# Patient Record
Sex: Female | Born: 1960
Health system: Southern US, Community
[De-identification: ages and names within clinical notes are randomized; demographics above are authoritative.]

## PROBLEM LIST (undated history)

## (undated) DIAGNOSIS — N920 Excessive and frequent menstruation with regular cycle: Secondary | ICD-10-CM

## (undated) DIAGNOSIS — Z973 Presence of spectacles and contact lenses: Secondary | ICD-10-CM

## (undated) DIAGNOSIS — D509 Iron deficiency anemia, unspecified: Secondary | ICD-10-CM

## (undated) DIAGNOSIS — D219 Benign neoplasm of connective and other soft tissue, unspecified: Secondary | ICD-10-CM

## (undated) HISTORY — PX: WISDOM TOOTH EXTRACTION: SHX21

---

## 2002-10-07 ENCOUNTER — Other Ambulatory Visit: Admission: RE | Admit: 2002-10-07 | Discharge: 2002-10-07 | Payer: Self-pay | Admitting: Obstetrics & Gynecology

## 2003-05-19 ENCOUNTER — Inpatient Hospital Stay (HOSPITAL_COMMUNITY): Admission: AD | Admit: 2003-05-19 | Discharge: 2003-05-20 | Payer: Self-pay | Admitting: Obstetrics & Gynecology

## 2003-06-29 ENCOUNTER — Other Ambulatory Visit: Admission: RE | Admit: 2003-06-29 | Discharge: 2003-06-29 | Payer: Self-pay | Admitting: Obstetrics & Gynecology

## 2004-09-18 ENCOUNTER — Encounter: Admission: RE | Admit: 2004-09-18 | Discharge: 2004-09-18 | Payer: Self-pay | Admitting: Obstetrics & Gynecology

## 2005-11-05 ENCOUNTER — Encounter: Admission: RE | Admit: 2005-11-05 | Discharge: 2005-11-05 | Payer: Self-pay | Admitting: Obstetrics & Gynecology

## 2005-12-06 ENCOUNTER — Encounter: Admission: RE | Admit: 2005-12-06 | Discharge: 2005-12-06 | Payer: Self-pay | Admitting: Obstetrics & Gynecology

## 2007-01-27 ENCOUNTER — Encounter: Admission: RE | Admit: 2007-01-27 | Discharge: 2007-01-27 | Payer: Self-pay | Admitting: Obstetrics & Gynecology

## 2008-03-15 ENCOUNTER — Encounter: Admission: RE | Admit: 2008-03-15 | Discharge: 2008-03-15 | Payer: Self-pay | Admitting: Obstetrics & Gynecology

## 2009-07-25 ENCOUNTER — Encounter: Admission: RE | Admit: 2009-07-25 | Discharge: 2009-07-25 | Payer: Self-pay | Admitting: Obstetrics & Gynecology

## 2010-12-01 NOTE — H&P (Signed)
NAMETHAI, HEMRICK                        ACCOUNT NO.:  [id number]   MEDICAL RECORD NO.:  [id number]                   PATIENT TYPE:  INP   LOCATION:  9116                                 FACILITY:  [facility]   PHYSICIAN:  Genia Del, M.D.             DATE OF BIRTH:  17-Jul-1960   DATE OF ADMISSION:  05/19/2003  DATE OF DISCHARGE:                                HISTORY & PHYSICAL   HISTORY AND PHYSICAL:  Mrs. Goffredo is a 50 year old G2, P36, expected date  of delivery May 19, 2003 at [redacted] weeks gestation.   REASON FOR ADMISSION:  Induction, history of preeclampsia.   HISTORY OF PRESENT ILLNESS:  Fetal movements positive.  No regular uterine  contractions.  No vaginal bleeding.  No fluid leak.  No current PIH  symptoms.   PAST MEDICAL HISTORY:  Positive for mild asthma and migraines.   PAST SURGICAL HISTORY:  Negative.   PAST OBSTETRICAL HISTORY:  G1 September 2003 - 40 weeks, spontaneous vaginal  delivery, baby girl, 9 pounds and 2 ounces with preeclampsia, no  complication.   ALLERGIES:  No known drug allergies.   MEDICATIONS:  Prenate vitamins.   SOCIAL HISTORY:  Married, nonsmoker.   HISTORY OF PRESENT PREGNANCY:  First trimester normal.  Labs showed  hemoglobin at 12.4, platelets 217, blood group B positive, Rh antibodies  negative, RPR nonreactive, HBsAg negative, HIV nonreactive, rubella titer is  immune, Pap smear test normal, gonorrhea and Chlamydia negative.  Because of  advanced maternal age patient opted for chorionic villus sampling at 9-plus  weeks, chromosomes came back normal and no complication occurred following  the procedure.  In the second trimester alpha-fetoproteins were within  normal and ultrasound review of anatomy at 21-plus weeks was within normal  limits, placenta anterior normal, cervix 3.8 cm and closed, dating  corresponded and amniotic fluid was normal.  In the third trimester 1-hour  GGT was within normal limits at 139,  hemoglobin at 13, blood pressures  remained normal throughout pregnancy although diastolics were on occasions  in the 80s, maximum was 84 diastolic at 36-plus weeks, proteins remained  negative and no PIH symptoms occurred, group B strep was positive at 35-plus  weeks.  Uterine heights corresponded well throughout pregnancy.   REVIEW OF SYSTEMS:  CONSTITUTIONAL:  Negative.  HEENT:  Negative.  CARDIOVASCULAR/RESPIRATORY:  Negative.  UROLOGY/GI:  Negative.  DERMATOLOGICAL/NEUROLOGICAL/ENDOCRINOLOGICAL:  Negative.   PHYSICAL EXAMINATION:  GENERAL:  No apparent distress.  VITAL SIGNS:  Stable.  Blood pressure 114/72, pulse regular, respiratory  rate normal, afebrile.  LUNGS:  Clear bilaterally.  HEART:  Regular cardiac rhythm.  ABDOMEN:  Gravid uterus, uterine height 36 cm, cephalic presentation.  CERVIX:  Vaginal exam 3 cm dilated, 70-80% effaced, vertex, -1.  Membranes  intact.  EXTREMITIES:  Lower limbs normal.  No edema.   Fetal heart rate 130 per minute.   IMPRESSION:  G2 P1 at [redacted] weeks gestation, history  of preeclampsia in first  pregnancy, no current symptoms of preeclampsia, group B streptococcus  positive, fetal well being reassuring, favorable cervix.   PLAN:  Induction with low-dose Pitocin and artificial rupture of membranes,  monitoring, expectant management towards probable vaginal delivery.                                               Genia Del, M.D.    ML/MEDQ  D:  05/19/2003  T:  05/19/2003  Job:  [id number]

## 2011-01-30 ENCOUNTER — Other Ambulatory Visit: Payer: Self-pay | Admitting: Obstetrics & Gynecology

## 2011-01-30 DIAGNOSIS — Z1231 Encounter for screening mammogram for malignant neoplasm of breast: Secondary | ICD-10-CM

## 2011-02-12 ENCOUNTER — Ambulatory Visit
Admission: RE | Admit: 2011-02-12 | Discharge: 2011-02-12 | Disposition: A | Discharge status: Home / Self Care | Payer: 59 | Source: Ambulatory Visit | Attending: Obstetrics & Gynecology | Admitting: Obstetrics & Gynecology

## 2011-02-12 DIAGNOSIS — Z1231 Encounter for screening mammogram for malignant neoplasm of breast: Secondary | ICD-10-CM

## 2011-02-13 ENCOUNTER — Other Ambulatory Visit: Payer: Self-pay | Admitting: Obstetrics & Gynecology

## 2011-02-13 DIAGNOSIS — R928 Other abnormal and inconclusive findings on diagnostic imaging of breast: Secondary | ICD-10-CM

## 2011-02-22 ENCOUNTER — Ambulatory Visit
Admission: RE | Admit: 2011-02-22 | Discharge: 2011-02-22 | Disposition: A | Discharge status: Home / Self Care | Payer: 59 | Source: Ambulatory Visit | Attending: Obstetrics & Gynecology | Admitting: Obstetrics & Gynecology

## 2011-02-22 DIAGNOSIS — R928 Other abnormal and inconclusive findings on diagnostic imaging of breast: Secondary | ICD-10-CM

## 2014-11-24 ENCOUNTER — Other Ambulatory Visit: Payer: Self-pay | Admitting: Pediatrics

## 2014-11-24 MED ORDER — TRIAMCINOLONE ACETONIDE 0.1 % EX CREA: 1.0000 "application " | TOPICAL_CREAM | Freq: Two times a day (BID) | CUTANEOUS | Status: DC

## 2014-11-29 ENCOUNTER — Other Ambulatory Visit: Payer: Self-pay | Admitting: Pediatrics

## 2014-11-29 DIAGNOSIS — L03116 Cellulitis of left lower limb: Secondary | ICD-10-CM

## 2014-11-29 DIAGNOSIS — L237 Allergic contact dermatitis due to plants, except food: Secondary | ICD-10-CM

## 2014-11-29 MED ORDER — CEPHALEXIN 500 MG PO CAPS: 500.0000 mg | ORAL_CAPSULE | Freq: Three times a day (TID) | ORAL | Status: AC

## 2014-11-29 MED ORDER — PREDNISONE 20 MG PO TABS: ORAL_TABLET | ORAL | Status: DC

## 2014-11-29 NOTE — Progress Notes (Signed)
Seen in office with severe eruption of poison ivy for more than a week, now infected. Has used only topical steroid to date, without improvement.

## 2015-09-14 DIAGNOSIS — Z01419 Encounter for gynecological examination (general) (routine) without abnormal findings: Secondary | ICD-10-CM | POA: Diagnosis not present

## 2015-09-14 DIAGNOSIS — Z1151 Encounter for screening for human papillomavirus (HPV): Secondary | ICD-10-CM | POA: Diagnosis not present

## 2015-09-21 DIAGNOSIS — N920 Excessive and frequent menstruation with regular cycle: Secondary | ICD-10-CM | POA: Diagnosis not present

## 2015-10-11 ENCOUNTER — Other Ambulatory Visit: Payer: Self-pay | Admitting: Obstetrics & Gynecology

## 2015-10-26 ENCOUNTER — Encounter (HOSPITAL_COMMUNITY): Payer: Self-pay | Admitting: *Deleted

## 2015-10-26 ENCOUNTER — Ambulatory Visit (HOSPITAL_COMMUNITY): Payer: 59 | Admitting: Anesthesiology

## 2015-10-26 ENCOUNTER — Encounter (HOSPITAL_COMMUNITY)
Admission: RE | Disposition: A | Discharge status: Home / Self Care | Payer: Self-pay | Source: Ambulatory Visit | Attending: Obstetrics & Gynecology

## 2015-10-26 ENCOUNTER — Ambulatory Visit (HOSPITAL_COMMUNITY)
Admission: RE | Admit: 2015-10-26 | Discharge: 2015-10-26 | Disposition: A | Discharge status: Home / Self Care | Payer: 59 | Source: Ambulatory Visit | Attending: Obstetrics & Gynecology | Admitting: Obstetrics & Gynecology

## 2015-10-26 DIAGNOSIS — N854 Malposition of uterus: Secondary | ICD-10-CM | POA: Insufficient documentation

## 2015-10-26 DIAGNOSIS — D250 Submucous leiomyoma of uterus: Secondary | ICD-10-CM | POA: Insufficient documentation

## 2015-10-26 DIAGNOSIS — D649 Anemia, unspecified: Secondary | ICD-10-CM | POA: Diagnosis not present

## 2015-10-26 DIAGNOSIS — D259 Leiomyoma of uterus, unspecified: Secondary | ICD-10-CM | POA: Diagnosis not present

## 2015-10-26 HISTORY — PX: DILATATION & CURETTAGE/HYSTEROSCOPY WITH MYOSURE: SHX6511

## 2015-10-26 LAB — CBC
HCT: 38.2 % (ref 36.0–46.0)
HEMOGLOBIN: 12.8 g/dL (ref 12.0–15.0)
MCH: 30.5 pg (ref 26.0–34.0)
MCHC: 33.5 g/dL (ref 30.0–36.0)
MCV: 91.2 fL (ref 78.0–100.0)
Platelets: 173 10*3/uL (ref 150–400)
RBC: 4.19 MIL/uL (ref 3.87–5.11)
RDW: 14.4 % (ref 11.5–15.5)
WBC: 6.1 10*3/uL (ref 4.0–10.5)

## 2015-10-26 LAB — PREGNANCY, URINE: PREG TEST UR: NEGATIVE

## 2015-10-26 SURGERY — DILATATION & CURETTAGE/HYSTEROSCOPY WITH MYOSURE
Anesthesia: Monitor Anesthesia Care

## 2015-10-26 MED ORDER — LIDOCAINE HCL (CARDIAC) 20 MG/ML IV SOLN
INTRAVENOUS | Status: DC | PRN
  Administered 2015-10-26: 100 mg via INTRAVENOUS

## 2015-10-26 MED ORDER — ONDANSETRON HCL 4 MG/2ML IJ SOLN
INTRAMUSCULAR | Status: AC
  Filled 2015-10-26: qty 2

## 2015-10-26 MED ORDER — DEXAMETHASONE SODIUM PHOSPHATE 10 MG/ML IJ SOLN
INTRAMUSCULAR | Status: AC
  Filled 2015-10-26: qty 1

## 2015-10-26 MED ORDER — CHLOROPROCAINE HCL 1 % IJ SOLN
INTRAMUSCULAR | Status: AC
  Filled 2015-10-26: qty 30

## 2015-10-26 MED ORDER — CHLOROPROCAINE HCL 1 % IJ SOLN
INTRAMUSCULAR | Status: DC | PRN
  Administered 2015-10-26: 20 mL

## 2015-10-26 MED ORDER — HYDROCODONE-IBUPROFEN 5-200 MG PO TABS: 1.0000 | ORAL_TABLET | Freq: Three times a day (TID) | ORAL | Status: DC | PRN

## 2015-10-26 MED ORDER — SCOPOLAMINE 1 MG/3DAYS TD PT72
MEDICATED_PATCH | TRANSDERMAL | Status: AC
  Administered 2015-10-26: 1.5 mg via TRANSDERMAL
  Filled 2015-10-26: qty 1

## 2015-10-26 MED ORDER — PROPOFOL 10 MG/ML IV BOLUS
INTRAVENOUS | Status: AC
  Filled 2015-10-26: qty 20

## 2015-10-26 MED ORDER — SODIUM CHLORIDE 0.9 % IR SOLN
Status: DC | PRN
  Administered 2015-10-26: 3000 mL

## 2015-10-26 MED ORDER — KETOROLAC TROMETHAMINE 30 MG/ML IJ SOLN
INTRAMUSCULAR | Status: DC | PRN
  Administered 2015-10-26: 30 mg via INTRAVENOUS

## 2015-10-26 MED ORDER — OXYCODONE HCL 5 MG PO TABS: 5.0000 mg | ORAL_TABLET | Freq: Once | ORAL | Status: DC | PRN

## 2015-10-26 MED ORDER — LIDOCAINE HCL (CARDIAC) 20 MG/ML IV SOLN
INTRAVENOUS | Status: AC
  Filled 2015-10-26: qty 5

## 2015-10-26 MED ORDER — KETOROLAC TROMETHAMINE 30 MG/ML IJ SOLN
INTRAMUSCULAR | Status: AC
  Filled 2015-10-26: qty 1

## 2015-10-26 MED ORDER — SILVER NITRATE-POT NITRATE 75-25 % EX MISC
CUTANEOUS | Status: DC | PRN
  Administered 2015-10-26: 3

## 2015-10-26 MED ORDER — LACTATED RINGERS IV SOLN
INTRAVENOUS | Status: DC
  Administered 2015-10-26 (×3): via INTRAVENOUS

## 2015-10-26 MED ORDER — OXYCODONE HCL 5 MG/5ML PO SOLN: 5.0000 mg | Freq: Once | ORAL | Status: DC | PRN

## 2015-10-26 MED ORDER — CEFAZOLIN SODIUM-DEXTROSE 2-3 GM-% IV SOLR
INTRAVENOUS | Status: AC
  Filled 2015-10-26: qty 50

## 2015-10-26 MED ORDER — FENTANYL CITRATE (PF) 100 MCG/2ML IJ SOLN
INTRAMUSCULAR | Status: DC | PRN
  Administered 2015-10-26 (×2): 50 ug via INTRAVENOUS

## 2015-10-26 MED ORDER — FENTANYL CITRATE (PF) 100 MCG/2ML IJ SOLN
INTRAMUSCULAR | Status: AC
  Filled 2015-10-26: qty 2

## 2015-10-26 MED ORDER — MIDAZOLAM HCL 2 MG/2ML IJ SOLN
INTRAMUSCULAR | Status: AC
  Filled 2015-10-26: qty 2

## 2015-10-26 MED ORDER — ONDANSETRON HCL 4 MG/2ML IJ SOLN
INTRAMUSCULAR | Status: DC | PRN
  Administered 2015-10-26: 4 mg via INTRAVENOUS

## 2015-10-26 MED ORDER — MIDAZOLAM HCL 5 MG/5ML IJ SOLN
INTRAMUSCULAR | Status: DC | PRN
  Administered 2015-10-26: 2 mg via INTRAVENOUS

## 2015-10-26 MED ORDER — PROPOFOL 10 MG/ML IV BOLUS
INTRAVENOUS | Status: DC | PRN
  Administered 2015-10-26: 200 mg via INTRAVENOUS

## 2015-10-26 MED ORDER — MEPERIDINE HCL 25 MG/ML IJ SOLN: 6.2500 mg | INTRAMUSCULAR | Status: DC | PRN

## 2015-10-26 MED ORDER — CEFAZOLIN SODIUM-DEXTROSE 2-4 GM/100ML-% IV SOLN
2.0000 g | INTRAVENOUS | Status: AC
  Administered 2015-10-26: 2 g via INTRAVENOUS
  Filled 2015-10-26: qty 100

## 2015-10-26 MED ORDER — SCOPOLAMINE 1 MG/3DAYS TD PT72
1.0000 | MEDICATED_PATCH | Freq: Once | TRANSDERMAL | Status: DC
  Administered 2015-10-26: 1.5 mg via TRANSDERMAL

## 2015-10-26 MED ORDER — HYDROMORPHONE HCL 1 MG/ML IJ SOLN: 0.2500 mg | INTRAMUSCULAR | Status: DC | PRN

## 2015-10-26 MED ORDER — DEXAMETHASONE SODIUM PHOSPHATE 4 MG/ML IJ SOLN
INTRAMUSCULAR | Status: DC | PRN
  Administered 2015-10-26: 10 mg via INTRAVENOUS

## 2015-10-26 SURGICAL SUPPLY — 19 items
CANISTER SUCT 3000ML (MISCELLANEOUS) ×3 IMPLANT
CATH ROBINSON RED A/P 16FR (CATHETERS) ×3 IMPLANT
CLOTH BEACON ORANGE TIMEOUT ST (SAFETY) ×3 IMPLANT
DEVICE MYOSURE LITE (MISCELLANEOUS) IMPLANT
DEVICE MYOSURE REACH (MISCELLANEOUS) ×2 IMPLANT
ELECT REM PT RETURN 9FT ADLT (ELECTROSURGICAL) ×3
ELECTRODE REM PT RTRN 9FT ADLT (ELECTROSURGICAL) ×1 IMPLANT
FILTER ARTHROSCOPY CONVERTOR (FILTER) ×3 IMPLANT
GLOVE BIO SURGEON STRL SZ 6.5 (GLOVE) ×2 IMPLANT
GLOVE BIO SURGEONS STRL SZ 6.5 (GLOVE) ×1
GLOVE BIOGEL PI IND STRL 7.0 (GLOVE) ×2 IMPLANT
GLOVE BIOGEL PI INDICATOR 7.0 (GLOVE) ×4
GOWN STRL REUS W/TWL LRG LVL3 (GOWN DISPOSABLE) ×6 IMPLANT
PACK VAGINAL MINOR WOMEN LF (CUSTOM PROCEDURE TRAY) ×3 IMPLANT
PAD OB MATERNITY 4.3X12.25 (PERSONAL CARE ITEMS) ×3 IMPLANT
SEAL ROD LENS SCOPE MYOSURE (ABLATOR) ×3 IMPLANT
TOWEL OR 17X24 6PK STRL BLUE (TOWEL DISPOSABLE) ×6 IMPLANT
TUBING AQUILEX INFLOW (TUBING) ×3 IMPLANT
TUBING AQUILEX OUTFLOW (TUBING) ×3 IMPLANT

## 2015-10-26 NOTE — Discharge Instructions (Addendum)
Hysteroscopy, Care After °Refer to this sheet in the next few weeks. These instructions provide you with information on caring for yourself after your procedure. Your health care provider may also give you more specific instructions. Your treatment has been planned according to current medical practices, but problems sometimes occur. Call your health care provider if you have any problems or questions after your procedure.  °WHAT TO EXPECT AFTER THE PROCEDURE °After your procedure, it is typical to have the following: °· You may have some cramping. This normally lasts for a couple days. °· You may have bleeding. This can vary from light spotting for a few days to menstrual-like bleeding for 3-7 days. °HOME CARE INSTRUCTIONS °· Rest for the first 1-2 days after the procedure. °· Only take over-the-counter or prescription medicines as directed by your health care provider. Do not take aspirin. It can increase the chances of bleeding. °· Take showers instead of baths for 2 weeks or as directed by your health care provider. °· Do not drive for 24 hours or as directed. °· Do not drink alcohol while taking pain medicine. °· Do not use tampons, douche, or have sexual intercourse for 2 weeks or until your health care provider says it is okay. °· Take your temperature twice a day for 4-5 days. Write it down each time. °· Follow your health care provider's advice about diet, exercise, and lifting. °· If you develop constipation, you may: °¨ Take a mild laxative if your health care provider approves. °¨ Add bran foods to your diet. °¨ Drink enough fluids to keep your urine clear or pale yellow. °· Try to have someone with you or available to you for the first 24-48 hours, especially if you were given a general anesthetic. °· Follow up with your health care provider as directed. °SEEK MEDICAL CARE IF: °· You feel dizzy or lightheaded. °· You feel sick to your stomach (nauseous). °· You have abnormal vaginal discharge. °· You  have a rash. °· You have pain that is not controlled with medicine. °SEEK IMMEDIATE MEDICAL CARE IF: °· You have bleeding that is heavier than a normal menstrual period. °· You have a fever. °· You have increasing cramps or pain, not controlled with medicine. °· You have new belly (abdominal) pain. °· You pass out. °· You have pain in the tops of your shoulders (shoulder strap areas). °· You have shortness of breath. °  °This information is not intended to replace advice given to you by your health care provider. Make sure you discuss any questions you have with your health care provider. °  °Document Released: 04/22/2013 Document Reviewed: 04/22/2013 °Elsevier Interactive Patient Education ©2016 [employer] ° ° °Post Anesthesia Home Care Instructions ° °Activity: °Get plenty of rest for the remainder of the day. A responsible adult should stay with you for 24 hours following the procedure.  °For the next 24 hours, DO NOT: °-Drive a car °-Operate machinery °-Drink alcoholic beverages °-Take any medication unless instructed by your physician °-Make any legal decisions or sign important papers. ° °Meals: °Start with liquid foods such as gelatin or soup. Progress to regular foods as tolerated. Avoid greasy, spicy, heavy foods. If nausea and/or vomiting occur, drink only clear liquids until the nausea and/or vomiting subsides. Call your physician if vomiting continues. ° °Special Instructions/Symptoms: °Your throat may feel dry or sore from the anesthesia or the breathing tube placed in your throat during surgery. If this causes discomfort, gargle with warm salt water. The   discomfort should disappear within 24 hours. ° °If you had a scopolamine patch placed behind your ear for the management of post- operative nausea and/or vomiting: ° °1. The medication in the patch is effective for 72 hours, after which it should be removed.  Wrap patch in a tissue and discard in the trash. Wash hands thoroughly with soap and  water. °2. You may remove the patch earlier than 72 hours if you experience unpleasant side effects which may include dry mouth, dizziness or visual disturbances. °3. Avoid touching the patch. Wash your hands with soap and water after contact with the patch. °  ° ° °

## 2015-10-26 NOTE — Op Note (Signed)
10/26/2015  3:11 PM  PATIENT:  Kimberly Braun  55 y.o. female  PRE-OPERATIVE DIAGNOSIS:  Submucosal Fibroids  POST-OPERATIVE DIAGNOSIS:  Submucosal Fibroids  PROCEDURE:  Procedure(s): DILATATION & CURETTAGE/HYSTEROSCOPY WITH MYOSURE  SURGEON:  Surgeon(s): [name], MD  ASSISTANTS: None  ANESTHESIA:   general   PROCEDURE:  Under general anesthesia with laryngeal mask the patient is an lithotomy position. She was prepped with Betadine and the suprapubic, vulvar and vaginal areas. She is draped as usual. The bladder was catheterized. The vaginal exam reveals a retroverted uterus with no adnexal mass.  The speculum is inserted in the vagina and the anterior lip of the cervix is grasped with a tenaculum. A paracervical block is done with Nesacaine 1% a total of 20 cc at 4 and 8:00.  Dilation of the cervix with Hegar dilators up to 27 without difficulty.  The hysteroscope is inserted in the intrauterine cavity.  Inspection reveals 2 normal ostia, a thin endometrium, a left lower uterine submucosal myoma on a small pedicle, measuring about 2.5 cm in diameter.  A posterior myoma is also present with just a small bulging in the intrauterine cavity.  The Myosure instrument is inserted and the submucosal myoma is completely resected.  The posterior partially submucosal myoma is trimmed to bring it flush with the posterior uterine wall.  Hemostasis is adequate at all levels. Pictures were taken before and after resection.  The hysteroscope is removed and a systematic curettage is done with the sharp curet on all intrauterine surfaces.  Both resection material and curettage material sent to pathology together.  We go back in the intrauterine cavity with the hysteroscope and confirm a normal cavity with good hemostasis.  All instruments are removed.  Hemostasis was completed on the cervix with silver nitrate.  The speculum was removed.  The patient was brought to recovery room in good and stable  status.  ESTIMATED BLOOD LOSS: 75 cc FLUID DEFICIT:  400 cc   Intake/Output Summary (Last 24 hours) at 10/26/15 1511 Last data filed at 10/26/15 1507  Gross per 24 hour  Intake   1200 ml  Output    125 ml  Net   1075 ml     BLOOD ADMINISTERED:none   LOCAL MEDICATIONS USED:  Nesacaine 1%, Amount: 20 ml  SPECIMEN:  Source of Specimen:  Resection of SM myoma and endometrial curettings  DISPOSITION OF SPECIMEN:  PATHOLOGY  COUNTS:  YES  PLAN OF CARE: Transfer to PACU  [name]  10/26/2015 at 3:13 pm

## 2015-10-26 NOTE — H&P (Signed)
Kimberly Braun is an 55 y.o. 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 Anemia     Past Surgical History  Procedure Laterality Date  . Wisdom tooth extraction      History reviewed. No pertinent family history.  Social History:  reports that she has never smoked. She has never used smokeless tobacco. She reports that she does not drink alcohol or use illicit drugs.  Allergies: No Known Allergies  Prescriptions prior to admission  Medication Sig Dispense Refill Last Dose  . ferrous sulfate 325 (65 FE) MG tablet Take 325 mg by mouth 3 (three) times daily with meals.       ROS Neg  Blood pressure 138/78, pulse 53, temperature 97.8 F (36.6 C), temperature source Oral, resp. rate 20, height 5\' 5"  (1.651 m), weight 155 lb (70.308 kg), SpO2 100 %. Physical Exam   Pelvic US:  IU lesion c/w SM myoma 2.6 cm  Results for orders placed or performed during the hospital encounter of 10/26/15 (from the past 24 hour(s))  CBC     Status: None   Collection Time: 10/26/15  1:05 PM  Result Value Ref Range   WBC 6.1 4.0 - 10.5 K/uL   RBC 4.19 3.87 - 5.11 MIL/uL   Hemoglobin 12.8 12.0 - 15.0 g/dL   HCT 38.2 36.0 - 46.0 %   MCV 91.2 78.0 - 100.0 fL   MCH 30.5 26.0 - 34.0 pg   MCHC 33.5 30.0 - 36.0 g/dL   RDW 14.4 11.5 - 15.5 %   Platelets 173 150 - 400 K/uL  Pregnancy, urine     Status: None   Collection Time: 10/26/15  1:06 PM  Result Value Ref Range   Preg Test, Ur NEGATIVE NEGATIVE     Assessment/Plan: 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  Surgery and risks reviewed.  Informed consent signed.  Kimberly Braun,MARIE-LYNE 10/26/2015, 1:50 PM

## 2015-10-26 NOTE — Anesthesia Procedure Notes (Signed)
Procedure Name: LMA Insertion Date/Time: 10/26/2015 2:19 PM Performed by: Riki Sheer Pre-anesthesia Checklist: Patient identified, Emergency Drugs available, Suction available, Patient being monitored and Timeout performed Patient Re-evaluated:Patient Re-evaluated prior to inductionOxygen Delivery Method: Circle system utilized Preoxygenation: Pre-oxygenation with 100% oxygen Intubation Type: IV induction Ventilation: Mask ventilation without difficulty LMA: LMA inserted LMA Size: 4.0 Number of attempts: 1 Placement Confirmation: positive ETCO2,  CO2 detector and breath sounds checked- equal and bilateral Tube secured with: Tape Dental Injury: Teeth and Oropharynx as per pre-operative assessment

## 2015-10-26 NOTE — Transfer of Care (Signed)
Immediate Anesthesia Transfer of Care Note  Patient: Kimberly Braun  Procedure(s) Performed: Procedure(s): DILATATION & CURETTAGE/HYSTEROSCOPY WITH MYOSURE (N/A)  Patient Location: PACU  Anesthesia Type:General  Level of Consciousness: awake  Airway & Oxygen Therapy: Patient Spontanous Breathing  Post-op Assessment: Report given to PACU RN  Post vital signs: stable  Filed Vitals:   10/26/15 1308  BP: 138/78  Pulse: 53  Temp: 36.6 C  Resp: 20    Complications: No apparent anesthesia complications

## 2015-10-26 NOTE — Anesthesia Postprocedure Evaluation (Signed)
Anesthesia Post Note  Patient: Mystica Cederquist  Procedure(s) Performed: Procedure(s) (LRB): DILATATION & CURETTAGE/HYSTEROSCOPY WITH MYOSURE (N/A)  Patient location during evaluation: PACU Anesthesia Type: General Level of consciousness: awake and alert Pain management: pain level controlled Vital Signs Assessment: post-procedure vital signs reviewed and stable Respiratory status: spontaneous breathing, nonlabored ventilation, respiratory function stable and patient connected to nasal cannula oxygen Cardiovascular status: blood pressure returned to baseline and stable Postop Assessment: no signs of nausea or vomiting Anesthetic complications: no    Last Vitals:  Filed Vitals:   10/26/15 1640 10/26/15 1720  BP: 126/71 130/64  Pulse: 52 52  Temp: 36.6 C 36.6 C  Resp: 16 16    Last Pain: There were no vitals filed for this visit.               Effie Berkshire

## 2015-10-26 NOTE — Discharge Summary (Signed)
  Physician Discharge Summary  Patient ID: Kimberly Braun MRN: [id number] DOB/AGE: 12/22/1960 55 y.o.  Admit date: 10/26/2015 Discharge date: 10/26/2015  Admission Diagnoses: Submucosal Fibroids  Discharge Diagnoses: Submucosal Fibroids        Active Problems:   * No active hospital problems. *   Discharged Condition: good  Hospital Course: Outpatient  Consults: None  Treatments: surgery: Hysteroscopy Myosure resection, D+C  Disposition: D/C Home     Medication List    TAKE these medications        ferrous sulfate 325 (65 FE) MG tablet  Take 325 mg by mouth 3 (three) times daily with meals.     hydrocodone-ibuprofen 5-200 MG tablet  Commonly known as:  VICOPROFEN  Take 1 tablet by mouth every 8 (eight) hours as needed for pain.           Follow-up Information    Follow up with Maximillian Habibi,MARIE-LYNE, MD In 3 weeks.   Specialty:  Obstetrics and Gynecology   Contact information:   [address] [place] [address] [phone]       Signed: [name], MD 10/26/2015, 3:26 PM

## 2015-10-26 NOTE — Anesthesia Preprocedure Evaluation (Signed)
Anesthesia Evaluation  Patient identified by MRN, date of birth, ID band Patient awake    Reviewed: Allergy & Precautions, NPO status , Patient's Chart, lab work & pertinent test results  Airway Mallampati: II  TM Distance: >3 FB Neck ROM: Full    Dental no notable dental hx.    Pulmonary neg pulmonary ROS,    Pulmonary exam normal breath sounds clear to auscultation       Cardiovascular negative cardio ROS Normal cardiovascular exam Rhythm:Regular Rate:Normal     Neuro/Psych negative neurological ROS  negative psych ROS   GI/Hepatic negative GI ROS, Neg liver ROS,   Endo/Other  negative endocrine ROS  Renal/GU negative Renal ROS     Musculoskeletal negative musculoskeletal ROS (+)   Abdominal   Peds  Hematology negative hematology ROS (+)   Anesthesia Other Findings   Reproductive/Obstetrics negative OB ROS                             Anesthesia Physical Anesthesia Plan  ASA: I  Anesthesia Plan: MAC and General   Post-op Pain Management:    Induction: Intravenous  Airway Management Planned:   Additional Equipment:   Intra-op Plan:   Post-operative Plan: Extubation in OR  Informed Consent: I have reviewed the patients History and Physical, chart, labs and discussed the procedure including the risks, benefits and alternatives for the proposed anesthesia with the patient or authorized representative who has indicated his/her understanding and acceptance.   Dental advisory given  Plan Discussed with: CRNA  Anesthesia Plan Comments:         Anesthesia Quick Evaluation

## 2015-10-27 ENCOUNTER — Encounter (HOSPITAL_COMMUNITY): Payer: Self-pay | Admitting: Obstetrics & Gynecology

## 2015-11-16 DIAGNOSIS — N920 Excessive and frequent menstruation with regular cycle: Secondary | ICD-10-CM | POA: Diagnosis not present

## 2016-06-11 DIAGNOSIS — L723 Sebaceous cyst: Secondary | ICD-10-CM | POA: Diagnosis not present

## 2016-06-27 DIAGNOSIS — H52203 Unspecified astigmatism, bilateral: Secondary | ICD-10-CM | POA: Diagnosis not present

## 2016-06-27 DIAGNOSIS — H5213 Myopia, bilateral: Secondary | ICD-10-CM | POA: Diagnosis not present

## 2016-07-27 DIAGNOSIS — L7211 Pilar cyst: Secondary | ICD-10-CM | POA: Diagnosis not present

## 2016-07-27 DIAGNOSIS — L723 Sebaceous cyst: Secondary | ICD-10-CM | POA: Diagnosis not present

## 2017-06-18 ENCOUNTER — Other Ambulatory Visit: Payer: Self-pay | Admitting: Obstetrics & Gynecology

## 2017-06-18 DIAGNOSIS — Z139 Encounter for screening, unspecified: Secondary | ICD-10-CM

## 2017-07-04 ENCOUNTER — Encounter: Payer: Self-pay | Admitting: Obstetrics & Gynecology

## 2017-07-04 ENCOUNTER — Ambulatory Visit: Payer: 59 | Admitting: Obstetrics & Gynecology

## 2017-07-04 ENCOUNTER — Encounter (HOSPITAL_BASED_OUTPATIENT_CLINIC_OR_DEPARTMENT_OTHER): Payer: Self-pay | Admitting: *Deleted

## 2017-07-04 ENCOUNTER — Other Ambulatory Visit: Payer: Self-pay | Admitting: Obstetrics & Gynecology

## 2017-07-04 ENCOUNTER — Other Ambulatory Visit: Payer: Self-pay

## 2017-07-04 ENCOUNTER — Ambulatory Visit (INDEPENDENT_AMBULATORY_CARE_PROVIDER_SITE_OTHER): Payer: 59

## 2017-07-04 VITALS — BP 128/74

## 2017-07-04 DIAGNOSIS — Z01812 Encounter for preprocedural laboratory examination: Secondary | ICD-10-CM

## 2017-07-04 DIAGNOSIS — N921 Excessive and frequent menstruation with irregular cycle: Secondary | ICD-10-CM

## 2017-07-04 DIAGNOSIS — D649 Anemia, unspecified: Secondary | ICD-10-CM

## 2017-07-04 DIAGNOSIS — D250 Submucous leiomyoma of uterus: Secondary | ICD-10-CM

## 2017-07-04 LAB — CBC WITH DIFFERENTIAL/PLATELET
BASOS ABS: 43 {cells}/uL (ref 0–200)
Basophils Relative: 0.7 %
EOS PCT: 2.5 %
Eosinophils Absolute: 153 cells/uL (ref 15–500)
HEMATOCRIT: 27.3 % — AB (ref 35.0–45.0)
HEMOGLOBIN: 9.2 g/dL — AB (ref 11.7–15.5)
LYMPHS ABS: 1525 {cells}/uL (ref 850–3900)
MCH: 31.9 pg (ref 27.0–33.0)
MCHC: 33.7 g/dL (ref 32.0–36.0)
MCV: 94.8 fL (ref 80.0–100.0)
MPV: 8.8 fL (ref 7.5–12.5)
Monocytes Relative: 8.6 %
NEUTROS ABS: 3855 {cells}/uL (ref 1500–7800)
Neutrophils Relative %: 63.2 %
Platelets: 248 10*3/uL (ref 140–400)
RBC: 2.88 10*6/uL — AB (ref 3.80–5.10)
RDW: 13.1 % (ref 11.0–15.0)
Total Lymphocyte: 25 %
WBC mixed population: 525 cells/uL (ref 200–950)
WBC: 6.1 10*3/uL (ref 3.8–10.8)

## 2017-07-04 NOTE — Progress Notes (Addendum)
    Kimberly Braun [birth year] [id number]        56 y.o.  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  Physician.  RP:  Menometrorrhagia x 6 months  HPI:  Had [place], Myosure excision of a SM Myoma and partial resection of an IM/[place] posterior Myoma, D+C in 10/2015.  Pathology Benign.  Was well for about a year after the procedure, but for the last 6 months, has experienced heavy irregular bleeding.  Not seen for that problem and took Iron supplements only.  Last Hb last week at 8.9 per patient.  Feels week and tired.  Past medical history,surgical history, problem list, medications, allergies, family history and social history were all reviewed and documented in the EPIC chart.  Directed ROS with pertinent positives and negatives documented in the history of present illness/assessment and plan.  Exam:  Vitals:   07/04/17 1029  BP: 128/74   General appearance:  Normal  Abdomen:  Soft, NT, not distended, no mass felt.  Gyn exam:  Vulva normal.  Speculum:  Large necrotic prolapsed uterine Myoma through dilated cervix.  Myoma measures about 5 x 4 cm.  Bimanual exam:  Base of pedicle attachment of Myoma not reachable, probably wide and high.  Uterus AV, normal volume, mobile, NT.  No adnexal mass felt.  No current active bleeding.  Pelvic US today: T/V images.  Uterus anteverted and heterogenous measuring 8.46 x 5.66 x 5.86 cm.  Endometrial line normal at 7.3 mm.  Solid mass seen in the cervix/vaginal areas measuring 4.8 x 3.8 x 4.4 cm with feeder vessels extending from the myometrium to the mass.  Right and left ovaries normal.  No apparent mass in the right or left adnexa.  No free fluid in the posterior cul-de-sac.   Assessment/Plan:  56 y.o. G2P2   1. Submucous uterine fibroid Prolapsed partially necrotic uterine fibroid with Menometrorrhagia and secondary anemia.  Hb 9.2.  Will proceed with excision [facility] Myoma at [facility].  Surgery:  Excision of prolapsed/partially necrotic Myoma, possible Hysteroscopy,  D+C.  Surgery,  risks, benefits reviewed.  - US Transvaginal Non-OB  2. Menometrorrhagia As above - US Transvaginal Non-OB  3. Secondary anemia On Iron supplements.  4. Pre-operative laboratory examination Hb 9.2 - CBC w/Diff                        Patient was counseled as to the risk of surgery to include the following:  1. Infection (prohylactic antibiotics will be administered)  2. DVT/Pulmonary Embolism (prophylactic pneumo compression stockings will be used)  3.Trauma to internal organs requiring additional surgical procedure to repair any injury to internal organs requiring perhaps additional hospitalization days.  4.Hemmorhage requiring transfusion and blood products which carry risks such as anaphylactic reaction, hepatitis and AIDS  Patient had received literature information on the procedure scheduled and all her questions were answered and fully accepts all risk.   [name], 10:45 AM 07/04/2017

## 2017-07-04 NOTE — Progress Notes (Signed)
SPOKE W/ PT VIA PHONE FOR PRE-OP INTERVIEW.  NPO AFTER MN.  ARRIVE AT 0745.  PT GETTING LAB WORK DONE (CBC) AT OFFICE TODAY PER KATHY, DR [place], VIA PHONE. PRE-OP ORDERS PENDING.

## 2017-07-04 NOTE — Anesthesia Preprocedure Evaluation (Addendum)
Anesthesia Evaluation  Patient identified by MRN, date of birth, ID band Patient awake    Reviewed: Allergy & Precautions, NPO status , Patient's Chart, lab work & pertinent test results  Airway Mallampati: II  TM Distance: >3 FB Neck ROM: Full    Dental no notable dental hx. (+) Caps, Dental Advisory Given, Teeth Intact,    Pulmonary neg pulmonary ROS,    Pulmonary exam normal breath sounds clear to auscultation       Cardiovascular negative cardio ROS Normal cardiovascular exam Rhythm:Regular Rate:Normal     Neuro/Psych negative neurological ROS  negative psych ROS   GI/Hepatic negative GI ROS, Neg liver ROS,   Endo/Other  negative endocrine ROS  Renal/GU negative Renal ROS     Musculoskeletal negative musculoskeletal ROS (+)   Abdominal   Peds  Hematology negative hematology ROS (+) anemia ,   Anesthesia Other Findings   Reproductive/Obstetrics negative OB ROS                            Anesthesia Physical  Anesthesia Plan  ASA: I  Anesthesia Plan: MAC and General   Post-op Pain Management:    Induction: Intravenous  PONV Risk Score and Plan: 2 and Ondansetron, Dexamethasone and Treatment may vary due to age or medical condition  Airway Management Planned: LMA  Additional Equipment:   Intra-op Plan:   Post-operative Plan: Extubation in OR  Informed Consent: I have reviewed the patients History and Physical, chart, labs and discussed the procedure including the risks, benefits and alternatives for the proposed anesthesia with the patient or authorized representative who has indicated his/her understanding and acceptance.   Dental advisory given  Plan Discussed with: CRNA and Anesthesiologist  Anesthesia Plan Comments: ( )        Anesthesia Quick Evaluation

## 2017-07-05 ENCOUNTER — Other Ambulatory Visit: Payer: Self-pay

## 2017-07-05 ENCOUNTER — Encounter (HOSPITAL_BASED_OUTPATIENT_CLINIC_OR_DEPARTMENT_OTHER): Payer: Self-pay | Admitting: Anesthesiology

## 2017-07-05 ENCOUNTER — Encounter (HOSPITAL_BASED_OUTPATIENT_CLINIC_OR_DEPARTMENT_OTHER)
Admission: RE | Disposition: A | Discharge status: Home / Self Care | Payer: Self-pay | Source: Ambulatory Visit | Attending: Obstetrics & Gynecology

## 2017-07-05 ENCOUNTER — Ambulatory Visit (HOSPITAL_BASED_OUTPATIENT_CLINIC_OR_DEPARTMENT_OTHER): Payer: 59 | Admitting: Anesthesiology

## 2017-07-05 ENCOUNTER — Ambulatory Visit (HOSPITAL_BASED_OUTPATIENT_CLINIC_OR_DEPARTMENT_OTHER)
Admission: RE | Admit: 2017-07-05 | Discharge: 2017-07-05 | Disposition: A | Discharge status: Home / Self Care | Payer: 59 | Source: Ambulatory Visit | Attending: Obstetrics & Gynecology | Admitting: Obstetrics & Gynecology

## 2017-07-05 ENCOUNTER — Encounter (HOSPITAL_BASED_OUTPATIENT_CLINIC_OR_DEPARTMENT_OTHER): Payer: Self-pay | Admitting: *Deleted

## 2017-07-05 ENCOUNTER — Encounter: Payer: Self-pay | Admitting: Obstetrics & Gynecology

## 2017-07-05 DIAGNOSIS — Z79899 Other long term (current) drug therapy: Secondary | ICD-10-CM | POA: Diagnosis not present

## 2017-07-05 DIAGNOSIS — N920 Excessive and frequent menstruation with regular cycle: Secondary | ICD-10-CM | POA: Diagnosis not present

## 2017-07-05 DIAGNOSIS — D500 Iron deficiency anemia secondary to blood loss (chronic): Secondary | ICD-10-CM | POA: Diagnosis not present

## 2017-07-05 DIAGNOSIS — D649 Anemia, unspecified: Secondary | ICD-10-CM | POA: Diagnosis not present

## 2017-07-05 DIAGNOSIS — N921 Excessive and frequent menstruation with irregular cycle: Secondary | ICD-10-CM | POA: Diagnosis not present

## 2017-07-05 DIAGNOSIS — D259 Leiomyoma of uterus, unspecified: Secondary | ICD-10-CM | POA: Diagnosis not present

## 2017-07-05 DIAGNOSIS — D250 Submucous leiomyoma of uterus: Secondary | ICD-10-CM | POA: Insufficient documentation

## 2017-07-05 HISTORY — DX: Iron deficiency anemia, unspecified: D50.9

## 2017-07-05 HISTORY — PX: MYOMECTOMY: SHX85

## 2017-07-05 HISTORY — DX: Excessive and frequent menstruation with regular cycle: N92.0

## 2017-07-05 HISTORY — DX: Presence of spectacles and contact lenses: Z97.3

## 2017-07-05 HISTORY — DX: Benign neoplasm of connective and other soft tissue, unspecified: D21.9

## 2017-07-05 LAB — CBC
HEMATOCRIT: 27 % — AB (ref 36.0–46.0)
HEMOGLOBIN: 8.9 g/dL — AB (ref 12.0–15.0)
MCH: 32.4 pg (ref 26.0–34.0)
MCHC: 33 g/dL (ref 30.0–36.0)
MCV: 98.2 fL (ref 78.0–100.0)
Platelets: 216 10*3/uL (ref 150–400)
RBC: 2.75 MIL/uL — ABNORMAL LOW (ref 3.87–5.11)
RDW: 15.3 % (ref 11.5–15.5)
WBC: 4.6 10*3/uL (ref 4.0–10.5)

## 2017-07-05 LAB — TYPE AND SCREEN
ABO/RH(D): B POS
Antibody Screen: NEGATIVE

## 2017-07-05 LAB — POCT PREGNANCY, URINE: PREG TEST UR: NEGATIVE

## 2017-07-05 LAB — ABO/RH: ABO/RH(D): B POS

## 2017-07-05 SURGERY — MYOMECTOMY, UTERUS, VAGINAL APPROACH
Anesthesia: Monitor Anesthesia Care | Site: Vagina

## 2017-07-05 MED ORDER — FENTANYL CITRATE (PF) 100 MCG/2ML IJ SOLN
INTRAMUSCULAR | Status: AC
  Filled 2017-07-05: qty 2

## 2017-07-05 MED ORDER — ONDANSETRON HCL 4 MG/2ML IJ SOLN
4.0000 mg | Freq: Once | INTRAMUSCULAR | Status: DC | PRN
  Filled 2017-07-05: qty 2

## 2017-07-05 MED ORDER — MIDAZOLAM HCL 2 MG/2ML IJ SOLN
INTRAMUSCULAR | Status: DC | PRN
  Administered 2017-07-05: 2 mg via INTRAVENOUS

## 2017-07-05 MED ORDER — DEXAMETHASONE SODIUM PHOSPHATE 10 MG/ML IJ SOLN
INTRAMUSCULAR | Status: AC
  Filled 2017-07-05: qty 1

## 2017-07-05 MED ORDER — ONDANSETRON HCL 4 MG/2ML IJ SOLN
INTRAMUSCULAR | Status: DC | PRN
  Administered 2017-07-05: 4 mg via INTRAVENOUS

## 2017-07-05 MED ORDER — OXYCODONE HCL 5 MG/5ML PO SOLN
5.0000 mg | Freq: Once | ORAL | Status: DC | PRN
  Filled 2017-07-05: qty 5

## 2017-07-05 MED ORDER — MIDAZOLAM HCL 2 MG/2ML IJ SOLN
INTRAMUSCULAR | Status: AC
  Filled 2017-07-05: qty 2

## 2017-07-05 MED ORDER — LIDOCAINE 2% (20 MG/ML) 5 ML SYRINGE
INTRAMUSCULAR | Status: DC | PRN
  Administered 2017-07-05: 60 mg via INTRAVENOUS

## 2017-07-05 MED ORDER — FENTANYL CITRATE (PF) 100 MCG/2ML IJ SOLN
INTRAMUSCULAR | Status: DC | PRN
  Administered 2017-07-05: 50 ug via INTRAVENOUS

## 2017-07-05 MED ORDER — CEFAZOLIN SODIUM-DEXTROSE 2-4 GM/100ML-% IV SOLN
2.0000 g | INTRAVENOUS | Status: AC
  Administered 2017-07-05: 2 g via INTRAVENOUS
  Filled 2017-07-05: qty 100

## 2017-07-05 MED ORDER — KETOROLAC TROMETHAMINE 30 MG/ML IJ SOLN
INTRAMUSCULAR | Status: DC | PRN
  Administered 2017-07-05: 30 mg via INTRAVENOUS

## 2017-07-05 MED ORDER — OXYCODONE-ACETAMINOPHEN 7.5-325 MG PO TABS: 1.0000 | ORAL_TABLET | Freq: Four times a day (QID) | ORAL | 0 refills | Status: DC | PRN

## 2017-07-05 MED ORDER — ONDANSETRON HCL 4 MG/2ML IJ SOLN
INTRAMUSCULAR | Status: AC
  Filled 2017-07-05: qty 2

## 2017-07-05 MED ORDER — CHLOROPROCAINE HCL 1 % IJ SOLN
INTRAMUSCULAR | Status: DC | PRN
  Administered 2017-07-05: 20 mL

## 2017-07-05 MED ORDER — LIDOCAINE 2% (20 MG/ML) 5 ML SYRINGE
INTRAMUSCULAR | Status: AC
  Filled 2017-07-05: qty 5

## 2017-07-05 MED ORDER — CEFAZOLIN SODIUM-DEXTROSE 2-4 GM/100ML-% IV SOLN
INTRAVENOUS | Status: AC
  Filled 2017-07-05: qty 100

## 2017-07-05 MED ORDER — LACTATED RINGERS IV SOLN
INTRAVENOUS | Status: DC
  Filled 2017-07-05: qty 1000

## 2017-07-05 MED ORDER — PROPOFOL 10 MG/ML IV BOLUS
INTRAVENOUS | Status: DC | PRN
  Administered 2017-07-05: 160 mg via INTRAVENOUS

## 2017-07-05 MED ORDER — MEPERIDINE HCL 25 MG/ML IJ SOLN
6.2500 mg | INTRAMUSCULAR | Status: DC | PRN
  Filled 2017-07-05: qty 1

## 2017-07-05 MED ORDER — KETOROLAC TROMETHAMINE 30 MG/ML IJ SOLN
INTRAMUSCULAR | Status: AC
  Filled 2017-07-05: qty 1

## 2017-07-05 MED ORDER — FENTANYL CITRATE (PF) 100 MCG/2ML IJ SOLN
25.0000 ug | INTRAMUSCULAR | Status: DC | PRN
  Filled 2017-07-05: qty 1

## 2017-07-05 MED ORDER — LACTATED RINGERS IV SOLN
INTRAVENOUS | Status: DC
  Administered 2017-07-05 (×2): via INTRAVENOUS
  Filled 2017-07-05: qty 1000

## 2017-07-05 MED ORDER — DEXAMETHASONE SODIUM PHOSPHATE 10 MG/ML IJ SOLN
INTRAMUSCULAR | Status: DC | PRN
  Administered 2017-07-05: 10 mg via INTRAVENOUS

## 2017-07-05 MED ORDER — ACETAMINOPHEN 160 MG/5ML PO SOLN
325.0000 mg | ORAL | Status: DC | PRN
  Filled 2017-07-05: qty 20.3

## 2017-07-05 MED ORDER — OXYCODONE HCL 5 MG PO TABS
5.0000 mg | ORAL_TABLET | Freq: Once | ORAL | Status: DC | PRN
  Filled 2017-07-05: qty 1

## 2017-07-05 MED ORDER — PROPOFOL 10 MG/ML IV BOLUS
INTRAVENOUS | Status: AC
  Filled 2017-07-05: qty 40

## 2017-07-05 MED ORDER — ALBUMIN HUMAN 5 % IV SOLN
INTRAVENOUS | Status: DC | PRN
  Administered 2017-07-05: 10:00:00 via INTRAVENOUS

## 2017-07-05 MED ORDER — ALBUMIN HUMAN 5 % IV SOLN
INTRAVENOUS | Status: AC
  Filled 2017-07-05: qty 250

## 2017-07-05 MED ORDER — KETOROLAC TROMETHAMINE 30 MG/ML IJ SOLN
30.0000 mg | Freq: Once | INTRAMUSCULAR | Status: DC | PRN
  Filled 2017-07-05: qty 1

## 2017-07-05 MED ORDER — ACETAMINOPHEN 325 MG PO TABS
325.0000 mg | ORAL_TABLET | ORAL | Status: DC | PRN
  Filled 2017-07-05: qty 2

## 2017-07-05 SURGICAL SUPPLY — 32 items
BLADE EXTENDED COATED 6.5IN (ELECTRODE) ×3 IMPLANT
BLADE SURG 10 STRL SS (BLADE) ×3 IMPLANT
BLADE SURG 15 STRL LF DISP TIS (BLADE) ×1 IMPLANT
BLADE SURG 15 STRL SS (BLADE) ×4
CANISTER SUCT 3000ML PPV (MISCELLANEOUS) ×7 IMPLANT
CATH ROBINSON RED A/P 16FR (CATHETERS) ×4 IMPLANT
COUNTER NEEDLE 1200 MAGNETIC (NEEDLE) ×4 IMPLANT
DEVICE MYOSURE LITE (MISCELLANEOUS) IMPLANT
DEVICE MYOSURE REACH (MISCELLANEOUS) IMPLANT
DILATOR CANAL MILEX (MISCELLANEOUS) IMPLANT
ELECT REM PT RETURN 9FT ADLT (ELECTROSURGICAL) ×4
ELECTRODE REM PT RTRN 9FT ADLT (ELECTROSURGICAL) ×1 IMPLANT
FILTER ARTHROSCOPY CONVERTOR (FILTER) ×4 IMPLANT
GLOVE BIO SURGEON STRL SZ 6.5 (GLOVE) ×3 IMPLANT
GLOVE BIO SURGEONS STRL SZ 6.5 (GLOVE) ×1
GLOVE BIOGEL PI IND STRL 7.0 (GLOVE) ×4 IMPLANT
GLOVE BIOGEL PI INDICATOR 7.0 (GLOVE) ×4
GOWN STRL REUS W/TWL LRG LVL3 (GOWN DISPOSABLE) ×8 IMPLANT
IV NS IRRIG 3000ML ARTHROMATIC (IV SOLUTION) ×8 IMPLANT
MYOSURE XL FIBROID REM (MISCELLANEOUS)
PACK VAGINAL MINOR WOMEN LF (CUSTOM PROCEDURE TRAY) ×4 IMPLANT
PAD OB MATERNITY 4.3X12.25 (PERSONAL CARE ITEMS) ×4 IMPLANT
PAD PREP 24X48 CUFFED NSTRL (MISCELLANEOUS) ×4 IMPLANT
PENCIL BUTTON HOLSTER BLD 10FT (ELECTRODE) ×3 IMPLANT
SEAL ROD LENS SCOPE MYOSURE (ABLATOR) ×4 IMPLANT
SYSTEM TISS REMOVAL MYSR XL RM (MISCELLANEOUS) IMPLANT
TOWEL OR 17X24 6PK STRL BLUE (TOWEL DISPOSABLE) ×8 IMPLANT
TUBE CONNECTING 12'X1/4 (SUCTIONS) ×1
TUBE CONNECTING 12X1/4 (SUCTIONS) ×2 IMPLANT
TUBING AQUILEX INFLOW (TUBING) ×4 IMPLANT
TUBING AQUILEX OUTFLOW (TUBING) ×4 IMPLANT
YANKAUER SUCT BULB TIP NO VENT (SUCTIONS) ×3 IMPLANT

## 2017-07-05 NOTE — Anesthesia Postprocedure Evaluation (Signed)
Anesthesia Post Note  Patient: Kimberly Braun  Procedure(s) Performed: VAGINAL MYOMECTOMY, EXCISION OF FIBROID (N/A Vagina )     Patient location during evaluation: PACU Anesthesia Type: General Level of consciousness: awake and alert Pain management: pain level controlled Vital Signs Assessment: post-procedure vital signs reviewed and stable Respiratory status: spontaneous breathing, nonlabored ventilation, respiratory function stable and patient connected to nasal cannula oxygen Cardiovascular status: blood pressure returned to baseline and stable Postop Assessment: no apparent nausea or vomiting Anesthetic complications: no    Last Vitals:  Vitals:   07/05/17 1100 07/05/17 1115  BP: 119/83 124/84  Pulse: (!) 53 (!) 49  Resp: 10 11  Temp:    SpO2: 100% 100%    Last Pain:  Vitals:   07/05/17 0753  TempSrc: Oral                 Miroslav Gin

## 2017-07-05 NOTE — Transfer of Care (Signed)
Immediate Anesthesia Transfer of Care Note  Patient: Kimberly Braun  Procedure(s) Performed: VAGINAL MYOMECTOMY, EXCISION OF FIBROID (N/A Vagina )  Patient Location: PACU  Anesthesia Type:General  Level of Consciousness: awake, alert , oriented and patient cooperative  Airway & Oxygen Therapy: Patient Spontanous Breathing and Patient connected to nasal cannula oxygen  Post-op Assessment: Report given to RN and Post -op Vital signs reviewed and stable  Post vital signs: Reviewed and stable  Last Vitals:  Vitals:   07/05/17 0753  BP: 122/64  Pulse: 61  Resp: 16  Temp: 37.1 C  SpO2: 100%    Last Pain:  Vitals:   07/05/17 0753  TempSrc: Oral      Patients Stated Pain Goal: 6 (22/29/79 8921)  Complications: No apparent anesthesia complications

## 2017-07-05 NOTE — Op Note (Signed)
Operative Note  07/05/2017  10:51 AM  PATIENT:  Kimberly Braun  56 y.o. female  PRE-OPERATIVE DIAGNOSIS:  Prolapsed uterine fibroid, menorrhagia w anemia  POST-OPERATIVE DIAGNOSIS:  Prolapsed uterine fibroid, menorrhagia w anemia  PROCEDURE:  Procedure(s): VAGINAL MYOMECTOMY, EXCISION OF FIBROID  SURGEON:  Surgeon(s): [name], MD  ANESTHESIA:   general  FINDINGS:  Partially necrotic large submucosal uterine fibroid prolapsed in the vagina measuring 4 x 5 cm.  DESCRIPTION OF OPERATION: Under general anesthesia with laryngeal mask the patient is in lithotomy position.  She is prepped with Betadine on the suprapubic, vulvar and vaginal areas.  She is draped as usual.  Bladder is catheterized.  Timeout is done.  The vaginal exam confirms a large prolapsed uterine fibroid in the vagina with a normal size uterus and no adnexal mass.  The weighted speculum is inserted in the vagina.  A tenaculum was applied on the fibroid.  And another one at the posterior lip of the very dilated cervix.  A paracervical block is done with Nesacaine 1% a total of 20 cc at 4 and 8:00.  We used the Bovie with cauterization mode to progressively resect the fibroid in order to progress towards the attachment.  The fibroid measured about 4 cm x 5 cm.  Necrosis was present at its most vaginal portion, but the lower half of the fibroid towards the attachment was well vascularized with viable tissue.  The fibroid was attached with a wide base at the mid posterior aspect of the uterine cavity.  With the cervix dilated to at least 4 cm, we were able to put the retractor at the anterior aspect of the uterine cavity and obtained a very good visualization of the base of the fibroid.  We were able to resect the fibroid entirely and then cauterize the base for hemostasis.  Hemostasis was adequate.  All instruments were removed.  With the cervix so dilated a hysteroscopy was not possible, but anyways the intra-uterine  cavity was directly visualized and felt with the fingers and no lesion was present after complete resection of the fibroid.  The decision was made not to do a curettage to avoid disruption and bleeding at the bed of the just excised fibroid.  The weighted speculum was removed.  The patient was brought to recovery room in good and stable status.  ESTIMATED BLOOD LOSS: 200 mL   Intake/Output Summary (Last 24 hours) at 07/05/2017 1051 Last data filed at 07/05/2017 1037 Gross per 24 hour  Intake 1250 ml  Output 200 ml  Net 1050 ml     BLOOD ADMINISTERED:none  Albumin was given.  LOCAL MEDICATIONS USED: Nesacaine 1% Paracervical block  SPECIMEN:  Source of Specimen:  Uterine Fibroid  DISPOSITION OF SPECIMEN:  PATHOLOGY  COUNTS:  YES  PLAN OF CARE: Transfer to PACU  Marie-Lyne LavoieMD10:51 AM

## 2017-07-05 NOTE — Discharge Summary (Signed)
Physician Discharge Summary  Patient ID: Karaline Buresh MRN: [id number] DOB/AGE: 1960/07/23 56 y.o.  Admit date: 07/05/2017 Discharge date: 07/05/2017  Admission Diagnoses: prolapsed fibroid, menorrhagia w anemia   Discharge Diagnoses: Same Active Problems:   * No active hospital problems. *   Discharged Condition: good  Consults:None  Significant Diagnostic Studies: None  Treatments:surgery: Excision of Necrotic Submucosal Uterine Myoma Prolapsed in the vagina.  Vitals:   07/05/17 0753  BP: 122/64  Pulse: 61  Resp: 16  Temp: 98.7 F (37.1 C)  SpO2: 100%     Total I/O In: 1250 [I.V.:1000; IV Piggyback:250] Out: 200 [Blood:200]   Hospital Course: Good outpatient course  Discharge Exam: Good  Disposition: 01-Home or Self Care    Allergies as of 07/05/2017   No Known Allergies     Medication List    TAKE these medications   ferrous sulfate 325 (65 FE) MG tablet Take 325 mg by mouth 3 (three) times daily with meals.   oxyCODONE-acetaminophen 7.5-325 MG tablet Commonly known as:  PERCOCET Take 1 tablet by mouth every 6 (six) hours as needed for severe pain.        Follow-up Information    [name], MD Follow up in 3 week(s).   Specialty:  Obstetrics and Gynecology Contact information: [address] [place] [address] [phone]            Signed: [name] 07/05/2017, 10:48 AM

## 2017-07-05 NOTE — Anesthesia Procedure Notes (Signed)
Procedure Name: LMA Insertion Date/Time: 07/05/2017 9:47 AM Performed by: Wanita Chamberlain, CRNA Pre-anesthesia Checklist: Patient identified, Emergency Drugs available, Suction available, Patient being monitored and Timeout performed Patient Re-evaluated:Patient Re-evaluated prior to induction Oxygen Delivery Method: Circle system utilized Preoxygenation: Pre-oxygenation with 100% oxygen Induction Type: IV induction LMA: LMA inserted LMA Size: 4.0 Number of attempts: 1 Placement Confirmation: breath sounds checked- equal and bilateral and CO2 detector Tube secured with: Tape Dental Injury: Teeth and Oropharynx as per pre-operative assessment

## 2017-07-05 NOTE — Discharge Instructions (Addendum)
Myomectomy, Care After Refer to this sheet in the next few weeks. These instructions provide you with information on caring for yourself after your procedure. Your health care provider may also give you more specific instructions. Your treatment has been planned according to current medical practices, but problems sometimes occur. Call your health care provider if you have any problems or questions after your procedure. What can I expect after the procedure? After your procedure, it is typical to have the following:  Pain in your pelvis, cramping.  You will be given pain medicine to control the pain.  Tiredness. This is a normal part of the recovery process. Your energy level will return to normal over the next several weeks.  Constipation.  Vaginal bleeding. This is normal and should stop after 1-2 weeks.  Follow these instructions at home:  Only take over-the-counter or prescription medicines as directed by your health care provider. Avoid aspirin because it can cause bleeding.  Do not douche, use tampons, or have sexual intercourse until given permission by your health care provider.  Remove or change any bandages (dressings) as directed by your health care provider.  Take showers instead of baths as directed by your health care provider.  You will probably be able to go back to your normal routine after a few days. Do not do anything that requires extra effort until your health care provider says it is okay. Do not lift anything heavier than 15 pounds (6.8 kg) until your health care provider approves.  Walk daily but take frequent rest breaks if you tire easily.  Continue to practice deep breathing and coughing. If it hurts to cough, try holding a pillow against your belly as you cough.  If you become constipated, you may: ? Use a mild laxative if your health care provider approves. ? Add more fruit and bran to your diet. ? Drink enough fluids to keep your urine clear or pale  yellow.  Take your temperature twice a day and write it down.  Do not drink alcohol.  Do not drive until your health care provider approves.  Have someone help you at home for 1 week or until you can do your own household activities.  Follow up with your health care provider as directed. Contact a health care provider if:  You have a fever.  You have increasing pelvic pain that is not relieved with medicine.  You have nausea, vomiting, or diarrhea.  You have pain when you urinate, or you have blood in your urine.  You have a rash on your body.  You have pain or redness where your IV access tube was inserted.  You have redness, swelling, or any kind of drainage from an incision. Get help right away if:  You have weakness or lightheadedness.  You have pain, swelling, or redness in your legs.  You have chest pain.  You faint.  You have shortness of breath.  You have heavy vaginal bleeding.  Your incision is opening up. This information is not intended to replace advice given to you by your health care provider. Make sure you discuss any questions you have with your health care provider. Document Released: 11/22/2010 Document Revised: 12/08/2015 Document Reviewed: 02/11/2013 Elsevier Interactive Patient Education  2017 [place].  NO NSAIDS UNTIL 4:45 TODAY   Post Anesthesia Home Care Instructions  Activity: Get plenty of rest for the remainder of the day. A responsible individual must stay with you for 24 hours following the procedure.  For the  next 24 hours, DO NOT: -Drive a car -Paediatric nurse -Drink alcoholic beverages -Take any medication unless instructed by your physician -Make any legal decisions or sign important papers.  Meals: Start with liquid foods such as gelatin or soup. Progress to regular foods as tolerated. Avoid greasy, spicy, heavy foods. If nausea and/or vomiting occur, drink only clear liquids until the nausea and/or vomiting  subsides. Call your physician if vomiting continues.  Special Instructions/Symptoms: Your throat may feel dry or sore from the anesthesia or the breathing tube placed in your throat during surgery. If this causes discomfort, gargle with warm salt water. The discomfort should disappear within 24 hours.  If you had a scopolamine patch placed behind your ear for the management of post- operative nausea and/or vomiting:  1. The medication in the patch is effective for 72 hours, after which it should be removed.  Wrap patch in a tissue and discard in the trash. Wash hands thoroughly with soap and water. 2. You may remove the patch earlier than 72 hours if you experience unpleasant side effects which may include dry mouth, dizziness or visual disturbances. 3. Avoid touching the patch. Wash your hands with soap and water after contact with the patch.

## 2017-07-05 NOTE — H&P (Signed)
Kimberly Braun is an 56 y.o. female. 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  Physician.  RP:  Menometrorrhagia x 6 months  HPI:  No change x yesterday:  Had [place], Myosure excision of a SM Myoma and partial resection of an IM/[place] posterior Myoma, D+C in 10/2015.  Pathology Benign.  Was well for about a year after the procedure, but for the last 6 months, has experienced heavy irregular bleeding.  Not seen for that problem and took Iron supplements only.  Last Hb last week at 8.9 per patient.  Feels week and tired.   Pertinent Gynecological History: Menses: Menometrorrhagia x 6 months with secondary anemia Blood transfusions: none Sexually transmitted diseases: no past history Last mammogram: Normal Last pap: Normal   Menstrual History:  Patient's last menstrual period was 06/29/2017 (exact date).    Past Medical History:  Diagnosis Date  . Fibroid    VAGINA PROLAPSED  . Iron deficiency anemia   . Menorrhagia   . Wears glasses     Past Surgical History:  Procedure Laterality Date  . DILATATION & CURETTAGE/HYSTEROSCOPY WITH MYOSURE N/A 10/26/2015   Procedure: DILATATION & CURETTAGE/HYSTEROSCOPY WITH MYOSURE;  Surgeon: [name], MD;  Location: [facility];  Service: Gynecology;  Laterality: N/A;  . WISDOM TOOTH EXTRACTION      Family History  Problem Relation Age of Onset  . Hypertension Mother   . Breast cancer Paternal Aunt   . Cancer Maternal Grandfather        colon    Social History:  reports that  has never smoked. she has never used smokeless tobacco. She reports that she drinks alcohol. She reports that she does not use drugs.  Allergies: No Known Allergies  Medications Prior to Admission  Medication Sig Dispense Refill Last Dose  . ferrous sulfate 325 (65 FE) MG tablet Take 325 mg by mouth 3 (three) times daily with meals.    Taking    REVIEW OF SYSTEMS: A ROS was performed and pertinent positives and negatives are included in the history.  GENERAL: No fevers or chills. HEENT:  No change in vision, no earache, sore throat or sinus congestion. NECK: No pain or stiffness. CARDIOVASCULAR: No chest pain or pressure. No palpitations. PULMONARY: No shortness of breath, cough or wheeze. GASTROINTESTINAL: No abdominal pain, nausea, vomiting or diarrhea, melena or bright red blood per rectum. GENITOURINARY: No urinary frequency, urgency, hesitancy or dysuria. MUSCULOSKELETAL: No joint or muscle pain, no back pain, no recent trauma. DERMATOLOGIC: No rash, no itching, no lesions. ENDOCRINE: No polyuria, polydipsia, no heat or cold intolerance. No recent change in weight. HEMATOLOGICAL: No anemia or easy bruising or bleeding. NEUROLOGIC: No headache, seizures, numbness, tingling or weakness. PSYCHIATRIC: No depression, no loss of interest in normal activity or change in sleep pattern.     Blood pressure 122/64, pulse 61, temperature 98.7 F (37.1 C), temperature source Oral, resp. rate 16, height 5\' 5"  (1.651 m), weight 151 lb 14.4 oz (68.9 kg), last menstrual period 06/29/2017, SpO2 100 %.  Physical Exam:  See office notes  Results for orders placed or performed in visit on 07/04/17 (from the past 24 hour(s))  CBC w/Diff     Status: Abnormal   Collection Time: 07/04/17 11:22 AM  Result Value Ref Range   WBC 6.1 3.8 - 10.8 Thousand/uL   RBC 2.88 (L) 3.80 - 5.10 Million/uL   Hemoglobin 9.2 (L) 11.7 - 15.5 g/dL   HCT 27.3 (L) 35.0 - 45.0 %   MCV 94.8 80.0 - 100.0 fL  MCH 31.9 27.0 - 33.0 pg   MCHC 33.7 32.0 - 36.0 g/dL   RDW 13.1 11.0 - 15.0 %   Platelets 248 140 - 400 Thousand/uL   MPV 8.8 7.5 - 12.5 fL   Neutro Abs 3,855 1,500 - 7,800 cells/uL   Lymphs Abs 1,525 850 - 3,900 cells/uL   WBC mixed population 525 200 - 950 cells/uL   Eosinophils Absolute 153 15 - 500 cells/uL   Basophils Absolute 43 0 - 200 cells/uL   Neutrophils Relative % 63.2 %   Total Lymphocyte 25.0 %   Monocytes Relative 8.6 %   Eosinophils Relative 2.5 %   Basophils Relative 0.7 %   Pelvic US  today:  Large prolapsed, partially necrotic Fibroid 4x5 cm with submucosal attachment.  Uterus otherwise normal.  Bilateral ovaries normal   Assessment/Plan:  56 y.o. G2P2   1. Submucous uterine fibroid Prolapsed partially necrotic uterine fibroid with Menometrorrhagia and secondary anemia.  Hb 9.2.  Will proceed with excision of Myoma at [facility].  Surgery:  Excision of prolapsed/partially necrotic Myoma, possible Hysteroscopy,  D+C.  Surgery, risks, benefits reviewed.  - US Transvaginal Non-OB  2. Menometrorrhagia As above - US Transvaginal Non-OB  3. Secondary anemia On Iron supplements.  4. Pre-operative laboratory examination Hb 9.2 - CBC w/Diff                        Patient was counseled as to the risk of surgery to include the following:  1. Infection (prohylactic antibiotics will be administered)  2. DVT/Pulmonary Embolism (prophylactic pneumo compression stockings will be used)  3.Trauma to internal organs requiring additional surgical procedure to repair any injury to internal organs requiring perhaps additional hospitalization days.  4.Hemmorhage requiring transfusion and blood products which carry risks such as anaphylactic reaction, hepatitis and AIDS  Patient had received literature information on the procedure scheduled and all her questions were answered and fully accepts all risk.  Kimberly Braun 07/05/2017, 8:26 AM

## 2017-07-05 NOTE — Patient Instructions (Signed)
Pelvic US today:  Large prolapsed, partially necrotic Fibroid 4x5 cm with submucosal attachment.  Uterus otherwise normal.  Bilateral ovaries normal  1. Submucous uterine fibroid Prolapsed partially necrotic uterine fibroid with Menometrorrhagia and secondary anemia.  Hb 9.2.  Will proceed with excision of Myoma at [facility].  Surgery:  Excision of prolapsed/partially necrotic Myoma, possible Hysteroscopy,  D+C.  Surgery, risks, benefits reviewed.  - US Transvaginal Non-OB  2. Menometrorrhagia As above - US Transvaginal Non-OB  3. Secondary anemia On Iron supplements.  4. Pre-operative laboratory examination Hb 9.2 - CBC w/Diff  Deidre Ala, I am glad you came to see me today!

## 2017-07-10 ENCOUNTER — Encounter (HOSPITAL_BASED_OUTPATIENT_CLINIC_OR_DEPARTMENT_OTHER): Payer: Self-pay | Admitting: Obstetrics & Gynecology

## 2017-07-29 ENCOUNTER — Ambulatory Visit
Admission: RE | Admit: 2017-07-29 | Discharge: 2017-07-29 | Disposition: A | Discharge status: Home / Self Care | Payer: 59 | Source: Ambulatory Visit | Attending: Obstetrics & Gynecology | Admitting: Obstetrics & Gynecology

## 2017-07-29 DIAGNOSIS — Z1231 Encounter for screening mammogram for malignant neoplasm of breast: Secondary | ICD-10-CM | POA: Diagnosis not present

## 2017-07-29 DIAGNOSIS — Z139 Encounter for screening, unspecified: Secondary | ICD-10-CM

## 2017-08-01 ENCOUNTER — Ambulatory Visit (INDEPENDENT_AMBULATORY_CARE_PROVIDER_SITE_OTHER): Payer: 59 | Admitting: Obstetrics & Gynecology

## 2017-08-01 ENCOUNTER — Encounter: Payer: Self-pay | Admitting: Obstetrics & Gynecology

## 2017-08-01 VITALS — BP 132/78 | Ht 64.5 in | Wt 153.0 lb

## 2017-08-01 DIAGNOSIS — Z789 Other specified health status: Secondary | ICD-10-CM

## 2017-08-01 DIAGNOSIS — Z01419 Encounter for gynecological examination (general) (routine) without abnormal findings: Secondary | ICD-10-CM

## 2017-08-01 NOTE — Progress Notes (Signed)
Kimberly Braun 08/03/1960 [id number]   History:    57 y.o. G2P2L2 Married x 15 years.  Patient is a pediatrician.  RP:  Established patient presenting for annual gyn exam   HPI: Had a myomectomy of a submucosal uterine myoma that was prolapsing vaginally on July 05, 2017.  Has done very well postop with no further vaginal bleeding and no pelvic pain.  Feels that has more energy and is now able to climb 3 flights of stairs without shortness of breath.  No menopausal symptoms.  Uses condoms for contraception.  Urine and bowel movements normal.  Breasts normal.  Screening mammogram negative January 2019.  Health labs normal on physicians' day at [facility].  No colonoscopy done yet.  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  Gynecologic History No LMP recorded. Patient is not currently having periods (Reason: Other). Contraception: condoms as needed Last Pap: 2016. Results were: normal Last mammogram: 07/2017. Results were: Negative Will schedule screening colonoscopy  Obstetric History OB History  Gravida Para Term Preterm AB Living  2 2       2   SAB TAB Ectopic Multiple Live Births               # Outcome Date GA Lbr Len/2nd Weight Sex Delivery Anes PTL Lv  2 Para           1 Para                ROS: A ROS was performed and pertinent positives and negatives are included in the history.  GENERAL: No fevers or chills. HEENT: No change in vision, no earache, sore throat or sinus congestion. NECK: No pain or stiffness. CARDIOVASCULAR: No chest pain or pressure. No palpitations. PULMONARY: No shortness of breath, cough or wheeze. GASTROINTESTINAL: No abdominal pain, nausea, vomiting or diarrhea, melena or bright red blood per rectum. GENITOURINARY: No urinary frequency,  urgency, hesitancy or dysuria. MUSCULOSKELETAL: No joint or muscle pain, no back pain, no recent trauma. DERMATOLOGIC: No rash, no itching, no lesions. ENDOCRINE: No polyuria, polydipsia, no heat or cold intolerance. No recent change in weight. HEMATOLOGICAL: No anemia or easy bruising or bleeding. NEUROLOGIC: No headache, seizures, numbness, tingling or weakness. PSYCHIATRIC: No depression, no loss of interest in normal activity or change in sleep pattern.     Exam:   BP 132/78   Ht 5' 4.5" (1.638 m)   Wt 153 lb (69.4 kg)   BMI 25.86 kg/m   Body mass index is 25.86 kg/m.  General appearance : Well developed well nourished female. No acute distress HEENT: Eyes: no retinal hemorrhage or exudates,  Neck supple, trachea midline, no carotid bruits, no thyroidmegaly Lungs: Clear to auscultation, no rhonchi or wheezes, or rib retractions  Heart: Regular rate and rhythm, no murmurs or gallops Breast:Examined in sitting and supine position were symmetrical in appearance, no palpable masses or tenderness,  no skin retraction, no nipple inversion, no nipple discharge, no skin discoloration, no axillary or supraclavicular lymphadenopathy Abdomen: no palpable masses or tenderness, no rebound or guarding Extremities: no edema or skin discoloration or tenderness  Pelvic: Vulva normal  Bartholin, Urethra, Skene Glands: Within normal limits             Vagina: No gross lesions or discharge  Cervix: No gross lesions or discharge.  Pap  reflex done.  Uterus  AV, normal size, shape and consistency, non-tender and mobile  Adnexa  Without masses or tenderness  Anus and perineum  normal   Assessment/Plan:  57 y.o. female for annual exam   1. Encounter for routine gynecological examination with Papanicolaou smear of cervix Normal gynecologic exam.  Pap reflex done.  Breast exam normal.  Recent mammogram negative January 2019.  Health labs with [facility].  Will schedule screening colonoscopy. - Pap IG w/  reflex to HPV when [facility]-U  2. Use of condoms for contraception   [name], 2:19 PM 08/01/2017

## 2017-08-04 ENCOUNTER — Encounter: Payer: Self-pay | Admitting: Obstetrics & Gynecology

## 2017-08-04 NOTE — Patient Instructions (Signed)
1. Encounter for routine gynecological examination with Papanicolaou smear of cervix Normal gynecologic exam.  Pap reflex done.  Breast exam normal.  Recent mammogram negative January 2019.  Health labs with [facility].  Will schedule screening colonoscopy. - Pap IG w/ reflex to HPV when [facility]  2. Use of condoms for contraception  Sandie, good seeing you doing so well!  I will inform you of your results as soon as they are available.  Health Maintenance, Female Adopting a healthy lifestyle and getting preventive care can go a long way to promote health and wellness. Talk with your health care provider about what schedule of regular examinations is right for you. This is a good chance for you to check in with your provider about disease prevention and staying healthy. In between checkups, there are plenty of things you can do on your own. Experts have done a lot of research about which lifestyle changes and preventive measures are most likely to keep you healthy. Ask your health care provider for more information. Weight and diet Eat a healthy diet  Be sure to include plenty of vegetables, fruits, low-fat dairy products, and lean protein.  Do not eat a lot of foods high in solid fats, added sugars, or salt.  Get regular exercise. This is one of the most important things you can do for your health. ? Most adults should exercise for at least 150 minutes each week. The exercise should increase your heart rate and make you sweat (moderate-intensity exercise). ? Most adults should also do strengthening exercises at least twice a week. This is in addition to the moderate-intensity exercise.  Maintain a healthy weight  Body mass index (BMI) is a measurement that can be used to identify possible weight problems. It estimates body fat based on height and weight. Your health care provider can help determine your BMI and help you achieve or maintain a healthy weight.  For females 6 years of age and  older: ? A BMI below 18.5 is considered underweight. ? A BMI of 18.5 to 24.9 is normal. ? A BMI of 25 to 29.9 is considered overweight. ? A BMI of 30 and above is considered obese.  Watch levels of cholesterol and blood lipids  You should start having your blood tested for lipids and cholesterol at 56 years of age, then have this test every 5 years.  You may need to have your cholesterol levels checked more often if: ? Your lipid or cholesterol levels are high. ? You are older than 56 years of age. ? You are at high risk for heart disease.  Cancer screening Lung Cancer  Lung cancer screening is recommended for adults 86-74 years old who are at high risk for lung cancer because of a history of smoking.  A yearly low-dose CT scan of the lungs is recommended for people who: ? Currently smoke. ? Have quit within the past 15 years. ? Have at least a 30-pack-year history of smoking. A pack year is smoking an average of one pack of cigarettes a day for 1 year.  Yearly screening should continue until it has been 15 years since you quit.  Yearly screening should stop if you develop a health problem that would prevent you from having lung cancer treatment.  Breast Cancer  Practice breast self-awareness. This means understanding how your breasts normally appear and feel.  It also means doing regular breast self-exams. Let your health care provider know about any changes, no matter how small.  If you are in your 20s or 30s, you should have a clinical breast exam (CBE) by a health care provider every 1-3 years as part of a regular health exam.  If you are 14 or older, have a CBE every year. Also consider having a breast X-ray (mammogram) every year.  If you have a family history of breast cancer, talk to your health care provider about genetic screening.  If you are at high risk for breast cancer, talk to your health care provider about having an MRI and a mammogram every year.  Breast  cancer gene (BRCA) assessment is recommended for women who have family members with BRCA-related cancers. BRCA-related cancers include: ? Breast. ? Ovarian. ? Tubal. ? Peritoneal cancers.  Results of the assessment will determine the need for genetic counseling and BRCA1 and BRCA2 testing.  Cervical Cancer Your health care provider may recommend that you be screened regularly for cancer of the pelvic organs (ovaries, uterus, and vagina). This screening involves a pelvic examination, including checking for microscopic changes to the surface of your cervix (Pap test). You may be encouraged to have this screening done every 3 years, beginning at age 44.  For women ages 79-65, health care providers may recommend pelvic exams and Pap testing every 3 years, or they may recommend the Pap and pelvic exam, combined with testing for human papilloma virus (HPV), every 5 years. Some types of HPV increase your risk of cervical cancer. Testing for HPV may also be done on women of any age with unclear Pap test results.  Other health care providers may not recommend any screening for nonpregnant women who are considered low risk for pelvic cancer and who do not have symptoms. Ask your health care provider if a screening pelvic exam is right for you.  If you have had past treatment for cervical cancer or a condition that could lead to cancer, you need Pap tests and screening for cancer for at least 20 years after your treatment. If Pap tests have been discontinued, your risk factors (such as having a new sexual partner) need to be reassessed to determine if screening should resume. Some women have medical problems that increase the chance of getting cervical cancer. In these cases, your health care provider may recommend more frequent screening and Pap tests.  Colorectal Cancer  This type of cancer can be detected and often prevented.  Routine colorectal cancer screening usually begins at 56 years of age and  continues through 56 years of age.  Your health care provider may recommend screening at an earlier age if you have risk factors for colon cancer.  Your health care provider may also recommend using home test kits to check for hidden blood in the stool.  A small camera at the end of a tube can be used to examine your colon directly (sigmoidoscopy or colonoscopy). This is done to check for the earliest forms of colorectal cancer.  Routine screening usually begins at age 31.  Direct examination of the colon should be repeated every 5-10 years through 56 years of age. However, you may need to be screened more often if early forms of precancerous polyps or small growths are found.  Skin Cancer  Check your skin from head to toe regularly.  Tell your health care provider about any new moles or changes in moles, especially if there is a change in a mole's shape or color.  Also tell your health care provider if you have a mole that is  larger than the size of a pencil eraser.  Always use sunscreen. Apply sunscreen liberally and repeatedly throughout the day.  Protect yourself by wearing long sleeves, pants, a wide-brimmed hat, and sunglasses whenever you are outside.  Heart disease, diabetes, and high blood pressure  High blood pressure causes heart disease and increases the risk of stroke. High blood pressure is more likely to develop in: ? People who have blood pressure in the high end of the normal range (130-139/85-89 mm Hg). ? People who are overweight or obese. ? People who are African American.  If you are 38-43 years of age, have your blood pressure checked every 3-5 years. If you are 12 years of age or older, have your blood pressure checked every year. You should have your blood pressure measured twice-once when you are at a hospital or clinic, and once when you are not at a hospital or clinic. Record the average of the two measurements. To check your blood pressure when you are not  at a hospital or clinic, you can use: ? An automated blood pressure machine at a pharmacy. ? A home blood pressure monitor.  If you are between 64 years and 51 years old, ask your health care provider if you should take aspirin to prevent strokes.  Have regular diabetes screenings. This involves taking a blood sample to check your fasting blood sugar level. ? If you are at a normal weight and have a low risk for diabetes, have this test once every three years after 56 years of age. ? If you are overweight and have a high risk for diabetes, consider being tested at a younger age or more often. Preventing infection Hepatitis B  If you have a higher risk for hepatitis B, you should be screened for this virus. You are considered at high risk for hepatitis B if: ? You were born in a country where hepatitis B is common. Ask your health care provider which countries are considered high risk. ? Your parents were born in a high-risk country, and you have not been immunized against hepatitis B (hepatitis B vaccine). ? You have HIV or AIDS. ? You use needles to inject street drugs. ? You live with someone who has hepatitis B. ? You have had sex with someone who has hepatitis B. ? You get hemodialysis treatment. ? You take certain medicines for conditions, including cancer, organ transplantation, and autoimmune conditions.  Hepatitis C  Blood testing is recommended for: ? Everyone born from [birth year] through 1964. ? Anyone with known risk factors for hepatitis C.  Sexually transmitted infections (STIs)  You should be screened for sexually transmitted infections (STIs) including gonorrhea and chlamydia if: ? You are sexually active and are younger than 56 years of age. ? You are older than 55 years of age and your health care provider tells you that you are at risk for this type of infection. ? Your sexual activity has changed since you were last screened and you are at an increased risk for chlamydia  or gonorrhea. Ask your health care provider if you are at risk.  If you do not have HIV, but are at risk, it may be recommended that you take a prescription medicine daily to prevent HIV infection. This is called pre-exposure prophylaxis (PrEP). You are considered at risk if: ? You are sexually active and do not regularly use condoms or know the HIV status of your partner(s). ? You take drugs by injection. ? You are sexually active  with a partner who has HIV.  Talk with your health care provider about whether you are at high risk of being infected with HIV. If you choose to begin PrEP, you should first be tested for HIV. You should then be tested every 3 months for as long as you are taking PrEP. Pregnancy  If you are premenopausal and you may become pregnant, ask your health care provider about preconception counseling.  If you may become pregnant, take 400 to 800 micrograms (mcg) of folic acid every day.  If you want to prevent pregnancy, talk to your health care provider about birth control (contraception). Osteoporosis and menopause  Osteoporosis is a disease in which the bones lose minerals and strength with aging. This can result in serious bone fractures. Your risk for osteoporosis can be identified using a bone density scan.  If you are 67 years of age or older, or if you are at risk for osteoporosis and fractures, ask your health care provider if you should be screened.  Ask your health care provider whether you should take a calcium or vitamin D supplement to lower your risk for osteoporosis.  Menopause may have certain physical symptoms and risks.  Hormone replacement therapy may reduce some of these symptoms and risks. Talk to your health care provider about whether hormone replacement therapy is right for you. Follow these instructions at home:  Schedule regular health, dental, and eye exams.  Stay current with your immunizations.  Do not use any tobacco products  including cigarettes, chewing tobacco, or electronic cigarettes.  If you are pregnant, do not drink alcohol.  If you are breastfeeding, limit how much and how often you drink alcohol.  Limit alcohol intake to no more than 1 drink per day for nonpregnant women. One drink equals 12 ounces of beer, 5 ounces of wine, or 1 ounces of hard liquor.  Do not use street drugs.  Do not share needles.  Ask your health care provider for help if you need support or information about quitting drugs.  Tell your health care provider if you often feel depressed.  Tell your health care provider if you have ever been abused or do not feel safe at home. This information is not intended to replace advice given to you by your health care provider. Make sure you discuss any questions you have with your health care provider. Document Released: 01/15/2011 Document Revised: 12/08/2015 Document Reviewed: 04/05/2015 Elsevier Interactive Patient Education  [employer].

## 2017-08-05 LAB — PAP IG W/ RFLX HPV ASCU

## 2019-03-27 ENCOUNTER — Other Ambulatory Visit: Payer: Self-pay

## 2019-03-30 ENCOUNTER — Encounter: Payer: Self-pay | Admitting: Obstetrics & Gynecology

## 2019-03-30 ENCOUNTER — Other Ambulatory Visit: Payer: Self-pay

## 2019-03-30 ENCOUNTER — Ambulatory Visit (INDEPENDENT_AMBULATORY_CARE_PROVIDER_SITE_OTHER): Payer: 59 | Admitting: Obstetrics & Gynecology

## 2019-03-30 VITALS — BP 122/82 | Ht 64.1 in | Wt 153.6 lb

## 2019-03-30 DIAGNOSIS — Z780 Asymptomatic menopausal state: Secondary | ICD-10-CM | POA: Diagnosis not present

## 2019-03-30 DIAGNOSIS — Z01419 Encounter for gynecological examination (general) (routine) without abnormal findings: Secondary | ICD-10-CM

## 2019-03-30 NOTE — Progress Notes (Signed)
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  RP:  Established patient presenting for annual gyn exam   HPI: Postmenopause x 2 years.  Had Myomectomy 06/2017.  No PMB.  Well on no HRT.  No pelvic pain.  No pain with IC.  Breasts normal.  BMI 26.28.  Exercising regularly.    Past medical history,surgical history, family history and social history were all reviewed and documented in the EPIC chart.  Gynecologic History No LMP recorded. (Menstrual status: Other). Contraception: condoms and post menopausal status Last Pap: 07/2017. Results were: Negative Last mammogram: 07/2017. Results were: Negative Bone Density: Never Colonoscopy: Will schedule with Dr Collene Mares  Obstetric History OB History  Gravida Para Term Preterm AB Living  2 2       2   SAB TAB Ectopic Multiple Live Births               # Outcome Date GA Lbr Len/2nd Weight Sex Delivery Anes PTL Lv  2 Para           1 Para              ROS: A ROS was performed and pertinent positives and negatives are included in the history.  GENERAL: No fevers or chills. HEENT: No change in vision, no earache, sore throat or sinus congestion. NECK: No pain or stiffness. CARDIOVASCULAR: No chest pain or pressure. No palpitations. PULMONARY: No shortness of breath, cough or wheeze. GASTROINTESTINAL: No abdominal pain, nausea, vomiting or diarrhea, melena or bright red blood per rectum. GENITOURINARY: No urinary frequency, urgency, hesitancy or dysuria. MUSCULOSKELETAL: No joint or muscle pain, no back pain, no recent trauma. DERMATOLOGIC: No rash, no itching, no lesions. ENDOCRINE: No polyuria, polydipsia, no heat or cold intolerance. No recent change in weight. HEMATOLOGICAL: No anemia or easy bruising or bleeding. NEUROLOGIC: No headache, seizures, numbness, tingling or weakness. PSYCHIATRIC: No depression, no loss of interest in normal activity or change in sleep pattern.      Exam:   BP 122/82 (BP Location: Right Arm, Patient Position: Sitting, Cuff Size: Normal)   Ht 5' 4.1" (1.628 m)   Wt 153 lb 9.6 oz (69.7 kg)   BMI 26.28 kg/m   Body mass index is 26.28 kg/m.  General appearance : Well developed well nourished female. No acute distress HEENT: Eyes: no retinal hemorrhage or exudates,  Neck supple, trachea midline, no carotid bruits, no thyroidmegaly Lungs: Clear to auscultation, no rhonchi or wheezes, or rib retractions  Heart: Regular rate and rhythm, no murmurs or gallops Breast:Examined in sitting and supine position were symmetrical in appearance, no palpable masses or tenderness,  no skin retraction, no nipple inversion, no nipple discharge, no skin discoloration, no axillary or supraclavicular lymphadenopathy Abdomen: no palpable masses or tenderness, no rebound or guarding Extremities: no edema or skin discoloration or tenderness  Pelvic: Vulva: Normal             Vagina: No gross lesions or discharge  Cervix: No gross lesions or discharge  Uterus  AV, normal size, shape and consistency, non-tender and mobile  Adnexa  Without masses or tenderness  Anus: Normal   Assessment/Plan:  58 y.o. female for annual exam   1. Well female exam with routine gynecological exam Normal gynecologic exam.  Pap test January 2019 was negative, no indication to repeat this year.  Mammogram January 2019 was negative will schedule a screening mammogram now.  Will schedule colonoscopy with Dr. Collene Mares.  Body mass index 26.28.  Continue with healthy nutrition and fitness.  2. Postmenopause Well on no hormone replacement therapy.  No postmenopausal bleeding.  Vitamin D supplements, calcium intake of 1200 mg daily and regular weightbearing physical activity is recommended.  [name], 2:40 PM 03/30/2019

## 2019-04-02 DIAGNOSIS — Z1211 Encounter for screening for malignant neoplasm of colon: Secondary | ICD-10-CM | POA: Diagnosis not present

## 2019-04-02 MED FILL — PEG-3350 SOLUTION: 420 | 1 days supply | Qty: 4000 | Fill #0

## 2019-04-05 ENCOUNTER — Encounter: Payer: Self-pay | Admitting: Obstetrics & Gynecology

## 2019-04-05 NOTE — Patient Instructions (Signed)
1. Well female exam with routine gynecological exam Normal gynecologic exam.  Pap test January 2019 was negative, no indication to repeat this year.  Mammogram January 2019 was negative will schedule a screening mammogram now.  Will schedule colonoscopy with Dr. Collene Mares.  Body mass index 26.28.  Continue with healthy nutrition and fitness.  2. Postmenopause Well on no hormone replacement therapy.  No postmenopausal bleeding.  Vitamin D supplements, calcium intake of 1200 mg daily and regular weightbearing physical activity is recommended.  Kimberly Braun, it was a pleasure seeing you today!

## 2019-04-09 ENCOUNTER — Encounter: Payer: Self-pay | Admitting: *Deleted

## 2019-04-23 NOTE — Telephone Encounter (Signed)
My chart message came back unread. I called patient and relayed message and she did have labs done already and call normal.

## 2019-05-18 DIAGNOSIS — Z1211 Encounter for screening for malignant neoplasm of colon: Secondary | ICD-10-CM | POA: Diagnosis not present

## 2019-05-18 DIAGNOSIS — K635 Polyp of colon: Secondary | ICD-10-CM | POA: Diagnosis not present

## 2019-05-18 DIAGNOSIS — D122 Benign neoplasm of ascending colon: Secondary | ICD-10-CM | POA: Diagnosis not present

## 2019-06-01 ENCOUNTER — Other Ambulatory Visit: Payer: Self-pay

## 2019-06-01 DIAGNOSIS — Z20822 Contact with and (suspected) exposure to covid-19: Secondary | ICD-10-CM

## 2019-06-03 LAB — NOVEL CORONAVIRUS, NAA: SARS-CoV-2, NAA: NOT DETECTED

## 2019-07-29 DIAGNOSIS — F4323 Adjustment disorder with mixed anxiety and depressed mood: Secondary | ICD-10-CM | POA: Diagnosis not present

## 2019-08-14 DIAGNOSIS — F4323 Adjustment disorder with mixed anxiety and depressed mood: Secondary | ICD-10-CM | POA: Diagnosis not present

## 2019-08-27 DIAGNOSIS — F4323 Adjustment disorder with mixed anxiety and depressed mood: Secondary | ICD-10-CM | POA: Diagnosis not present

## 2019-09-10 DIAGNOSIS — F4323 Adjustment disorder with mixed anxiety and depressed mood: Secondary | ICD-10-CM | POA: Diagnosis not present

## 2019-10-01 DIAGNOSIS — F4323 Adjustment disorder with mixed anxiety and depressed mood: Secondary | ICD-10-CM | POA: Diagnosis not present

## 2019-10-05 DIAGNOSIS — S0502XA Injury of conjunctiva and corneal abrasion without foreign body, left eye, initial encounter: Secondary | ICD-10-CM | POA: Diagnosis not present

## 2019-10-05 MED FILL — OFLOXACIN 0.3% EYE DROPS: 0.3 | 19 days supply | Qty: 5 | Fill #0

## 2019-10-07 DIAGNOSIS — S0502XD Injury of conjunctiva and corneal abrasion without foreign body, left eye, subsequent encounter: Secondary | ICD-10-CM | POA: Diagnosis not present

## 2019-10-13 ENCOUNTER — Other Ambulatory Visit (HOSPITAL_COMMUNITY): Payer: Self-pay | Admitting: Family Medicine

## 2019-10-13 DIAGNOSIS — J309 Allergic rhinitis, unspecified: Secondary | ICD-10-CM | POA: Diagnosis not present

## 2019-10-13 DIAGNOSIS — D126 Benign neoplasm of colon, unspecified: Secondary | ICD-10-CM | POA: Diagnosis not present

## 2019-10-13 DIAGNOSIS — E039 Hypothyroidism, unspecified: Secondary | ICD-10-CM | POA: Diagnosis not present

## 2019-10-13 DIAGNOSIS — J4520 Mild intermittent asthma, uncomplicated: Secondary | ICD-10-CM | POA: Diagnosis not present

## 2019-10-13 MED FILL — FLUTICASONE PROP 50 MCG SPR: 50 | 60 days supply | Qty: 16 | Fill #0

## 2019-10-13 MED FILL — LEVOTHYROXINE SODIUM 25 MCG: 25 | 30 days supply | Qty: 30 | Fill #0

## 2019-11-05 DIAGNOSIS — F4323 Adjustment disorder with mixed anxiety and depressed mood: Secondary | ICD-10-CM | POA: Diagnosis not present

## 2019-11-10 MED FILL — LEVOTHYROXINE SODIUM 25 MCG: 25 | 30 days supply | Qty: 30 | Fill #1

## 2019-11-30 DIAGNOSIS — E039 Hypothyroidism, unspecified: Secondary | ICD-10-CM | POA: Diagnosis not present

## 2019-12-08 MED FILL — LEVOTHYROXINE SODIUM 25 MCG: 25 | 30 days supply | Qty: 30 | Fill #2

## 2019-12-17 DIAGNOSIS — F4323 Adjustment disorder with mixed anxiety and depressed mood: Secondary | ICD-10-CM | POA: Diagnosis not present

## 2020-01-07 MED FILL — LEVOTHYROXINE SODIUM 25 MCG: 25 | 30 days supply | Qty: 30 | Fill #3

## 2020-01-26 DIAGNOSIS — F4323 Adjustment disorder with mixed anxiety and depressed mood: Secondary | ICD-10-CM | POA: Diagnosis not present

## 2020-01-29 ENCOUNTER — Other Ambulatory Visit: Payer: Self-pay | Admitting: Obstetrics & Gynecology

## 2020-01-29 DIAGNOSIS — Z1231 Encounter for screening mammogram for malignant neoplasm of breast: Secondary | ICD-10-CM

## 2020-02-03 MED FILL — LEVOTHYROXINE SODIUM 25 MCG: 25 | 30 days supply | Qty: 30 | Fill #4

## 2020-02-03 MED FILL — FLUTICASONE PROP 50 MCG SPR: 50 | 60 days supply | Qty: 16 | Fill #1

## 2020-03-01 ENCOUNTER — Ambulatory Visit
Admission: RE | Admit: 2020-03-01 | Discharge: 2020-03-01 | Disposition: A | Discharge status: Home / Self Care | Payer: 59 | Source: Ambulatory Visit | Attending: Obstetrics & Gynecology | Admitting: Obstetrics & Gynecology

## 2020-03-01 ENCOUNTER — Other Ambulatory Visit: Payer: Self-pay

## 2020-03-01 DIAGNOSIS — Z1231 Encounter for screening mammogram for malignant neoplasm of breast: Secondary | ICD-10-CM

## 2020-03-07 MED FILL — LEVOTHYROXINE SODIUM 25 MCG: 25 | 30 days supply | Qty: 30 | Fill #5

## 2020-04-06 ENCOUNTER — Encounter: Payer: Self-pay | Admitting: Obstetrics & Gynecology

## 2020-04-06 ENCOUNTER — Other Ambulatory Visit: Payer: Self-pay

## 2020-04-06 ENCOUNTER — Ambulatory Visit (INDEPENDENT_AMBULATORY_CARE_PROVIDER_SITE_OTHER): Payer: 59 | Admitting: Obstetrics & Gynecology

## 2020-04-06 VITALS — BP 134/80 | Ht 64.0 in | Wt 148.0 lb

## 2020-04-06 DIAGNOSIS — Z780 Asymptomatic menopausal state: Secondary | ICD-10-CM | POA: Diagnosis not present

## 2020-04-06 DIAGNOSIS — Z01419 Encounter for gynecological examination (general) (routine) without abnormal findings: Secondary | ICD-10-CM

## 2020-04-06 MED FILL — LEVOTHYROXINE SODIUM 25 MCG: 25 | 30 days supply | Qty: 30 | Fill #6

## 2020-04-06 MED FILL — FLUTICASONE PROP 50 MCG SPR: 50 | 60 days supply | Qty: 16 | Fill #2

## 2020-04-06 NOTE — Progress Notes (Signed)
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  RP:  Established patient presenting for annual gyn exam   HPI: Postmenopause x 3 years.  Had Myomectomy 06/2017.  No PMB.  Well on no HRT.  No pelvic pain.  No pain with IC.  Breasts normal.  BMI 26.28.  Exercising regularly.  Health Labs with Fam MD.  Hypothyroidism on Synthroid.  Colonoscopy 05/2019 with Dr Collene Mares.  Past medical history,surgical history, family history and social history were all reviewed and documented in the EPIC chart.  Gynecologic History Patient's last menstrual period was 06/29/2017 (exact date).  Obstetric History OB History  Gravida Para Term Preterm AB Living  2 2       2   SAB TAB Ectopic Multiple Live Births               # Outcome Date GA Lbr Len/2nd Weight Sex Delivery Anes PTL Lv  2 Para           1 Para              ROS: A ROS was performed and pertinent positives and negatives are included in the history.  GENERAL: No fevers or chills. HEENT: No change in vision, no earache, sore throat or sinus congestion. NECK: No pain or stiffness. CARDIOVASCULAR: No chest pain or pressure. No palpitations. PULMONARY: No shortness of breath, cough or wheeze. GASTROINTESTINAL: No abdominal pain, nausea, vomiting or diarrhea, melena or bright red blood per rectum. GENITOURINARY: No urinary frequency, urgency, hesitancy or dysuria. MUSCULOSKELETAL: No joint or muscle pain, no back pain, no recent trauma. DERMATOLOGIC: No rash, no itching, no lesions. ENDOCRINE: No polyuria, polydipsia, no heat or cold intolerance. No recent change in weight. HEMATOLOGICAL: No anemia or easy bruising or bleeding. NEUROLOGIC: No headache, seizures, numbness, tingling or weakness. PSYCHIATRIC: No depression, no loss of interest in normal activity or change in sleep pattern.     Exam:   BP 134/80 (BP Location: Right Arm, Patient Position: Sitting, Cuff Size:  Normal)   Ht 5\' 4"  (1.626 m)   Wt 148 lb (67.1 kg)   LMP 06/29/2017 (Exact Date)   BMI 25.40 kg/m   Body mass index is 25.4 kg/m.  General appearance : Well developed well nourished female. No acute distress HEENT: Eyes: no retinal hemorrhage or exudates,  Neck supple, trachea midline, no carotid bruits, no thyroidmegaly Lungs: Clear to auscultation, no rhonchi or wheezes, or rib retractions  Heart: Regular rate and rhythm, no murmurs or gallops Breast:Examined in sitting and supine position were symmetrical in appearance, no palpable masses or tenderness,  no skin retraction, no nipple inversion, no nipple discharge, no skin discoloration, no axillary or supraclavicular lymphadenopathy Abdomen: no palpable masses or tenderness, no rebound or guarding Extremities: no edema or skin discoloration or tenderness  Pelvic: Vulva: Normal             Vagina: No gross lesions or discharge  Cervix: No gross lesions or discharge  Uterus  AV, normal size, shape and consistency, non-tender and mobile  Adnexa  Without masses or tenderness  Anus: Normal   Assessment/Plan:  59 y.o. female for annual exam   1. Well female exam with routine gynecological exam Normal gynecologic exam.  No indication to repeat a Pap test this year.  Breast exam normal.  Screening mammogram August 2021 -.  Colonoscopy November 2020 with Dr. Collene Mares.  Health  labs with family physician.  Diagnosis of hypothyroidism, now on Synthroid.  Good body mass index at 25.4.  Continue with fitness and healthy nutrition.  2. Postmenopause Well on no hormone replacement therapy.  No postmenopausal bleeding.  Will start a vitamin D supplement, calcium intake of 1500 mg daily recommended, continue with weightbearing physical activities.  Other orders - levothyroxine (SYNTHROID) 25 MCG tablet; Take 25 mcg by mouth daily.  [name], 2:10 PM 04/06/2020

## 2020-04-20 ENCOUNTER — Encounter: Payer: 59 | Admitting: Obstetrics & Gynecology

## 2020-05-09 MED FILL — LEVOTHYROXINE SODIUM 25 MCG: 25 | 30 days supply | Qty: 30 | Fill #7

## 2020-06-06 MED FILL — LEVOTHYROXINE SODIUM 25 MCG: 25 | 30 days supply | Qty: 30 | Fill #8

## 2020-07-12 MED FILL — LEVOTHYROXINE SODIUM 25 MCG: 25 | 30 days supply | Qty: 30 | Fill #9

## 2020-08-08 MED FILL — LEVOTHYROXINE SODIUM 25 MCG: 25 | 30 days supply | Qty: 30 | Fill #10

## 2020-09-05 MED FILL — LEVOTHYROXINE SODIUM 25 MCG: 25 | 30 days supply | Qty: 30 | Fill #11

## 2020-10-04 MED FILL — LEVOTHYROXINE SODIUM 25 MCG: 25 | 30 days supply | Qty: 30 | Fill #12

## 2020-11-10 ENCOUNTER — Other Ambulatory Visit (HOSPITAL_COMMUNITY): Payer: Self-pay

## 2020-11-10 DIAGNOSIS — J45909 Unspecified asthma, uncomplicated: Secondary | ICD-10-CM | POA: Diagnosis not present

## 2020-11-10 DIAGNOSIS — J309 Allergic rhinitis, unspecified: Secondary | ICD-10-CM | POA: Diagnosis not present

## 2020-11-10 DIAGNOSIS — E039 Hypothyroidism, unspecified: Secondary | ICD-10-CM | POA: Diagnosis not present

## 2020-11-10 DIAGNOSIS — K635 Polyp of colon: Secondary | ICD-10-CM | POA: Diagnosis not present

## 2020-11-10 DIAGNOSIS — Z8619 Personal history of other infectious and parasitic diseases: Secondary | ICD-10-CM | POA: Diagnosis not present

## 2020-11-10 DIAGNOSIS — Z0000 Encounter for general adult medical examination without abnormal findings: Secondary | ICD-10-CM | POA: Diagnosis not present

## 2020-11-10 MED ORDER — FLUTICASONE PROPIONATE 50 MCG/ACT NA SUSP
Freq: Every day | NASAL | 2 refills | Status: DC
  Filled 2020-11-10: qty 16, 60d supply, fill #0

## 2020-11-10 MED ORDER — ALBUTEROL SULFATE HFA 108 (90 BASE) MCG/ACT IN AERS
INHALATION_SPRAY | Freq: Four times a day (QID) | RESPIRATORY_TRACT | 5 refills | Status: AC
  Filled 2020-11-10: qty 8.5, 17d supply, fill #0

## 2020-11-10 MED ORDER — LEVOTHYROXINE SODIUM 50 MCG PO TABS
50.0000 ug | ORAL_TABLET | Freq: Every day | ORAL | 3 refills | Status: DC
  Filled 2020-11-10: qty 90, 90d supply, fill #0
  Filled 2021-01-23: qty 90, 90d supply, fill #1
  Filled 2021-05-05: qty 90, 90d supply, fill #2
  Filled 2021-08-02: qty 90, 90d supply, fill #3

## 2020-12-31 ENCOUNTER — Ambulatory Visit (INDEPENDENT_AMBULATORY_CARE_PROVIDER_SITE_OTHER): Payer: 59

## 2020-12-31 ENCOUNTER — Other Ambulatory Visit: Payer: Self-pay

## 2020-12-31 DIAGNOSIS — Z23 Encounter for immunization: Secondary | ICD-10-CM

## 2020-12-31 NOTE — Progress Notes (Signed)
   Covid-19 Vaccination Clinic  Name:  Kimberly Braun    MRN: [id number] DOB: [birth year]  12/31/2020  Kimberly Braun was observed post Covid-19 immunization for 15 minutes without incident. She was provided with Vaccine Information Sheet and instruction to access the V-Safe system.   Kimberly Braun was instructed to call 911 with any severe reactions post vaccine: Difficulty breathing  Swelling of face and throat  A fast heartbeat  A bad rash all over body  Dizziness and weakness   Immunizations Administered     Name Date Dose VIS Date Route   PFIZER Comrnaty(Gray TOP) Covid-19 Vaccine 12/31/2020  9:29 AM 0.3 mL 06/23/2020 Intramuscular   Manufacturer: [employer]   Lot: [id number]   [place]: [phone]

## 2021-01-13 ENCOUNTER — Other Ambulatory Visit (HOSPITAL_COMMUNITY): Payer: Self-pay

## 2021-01-13 DIAGNOSIS — E039 Hypothyroidism, unspecified: Secondary | ICD-10-CM | POA: Diagnosis not present

## 2021-01-13 MED ORDER — CARESTART COVID-19 HOME TEST VI KIT
PACK | 0 refills | Status: DC
  Filled 2021-01-13: qty 4, 4d supply, fill #0

## 2021-01-23 ENCOUNTER — Other Ambulatory Visit (HOSPITAL_COMMUNITY): Payer: Self-pay

## 2021-02-27 ENCOUNTER — Other Ambulatory Visit (HOSPITAL_COMMUNITY): Payer: Self-pay

## 2021-02-27 MED ORDER — CARESTART COVID-19 HOME TEST VI KIT
PACK | 0 refills | Status: DC
  Filled 2021-02-27: qty 2, 2d supply, fill #0

## 2021-05-05 ENCOUNTER — Other Ambulatory Visit (HOSPITAL_COMMUNITY): Payer: Self-pay

## 2021-06-21 ENCOUNTER — Other Ambulatory Visit: Payer: Self-pay | Admitting: Obstetrics & Gynecology

## 2021-06-21 DIAGNOSIS — F4322 Adjustment disorder with anxiety: Secondary | ICD-10-CM | POA: Diagnosis not present

## 2021-06-21 DIAGNOSIS — Z1231 Encounter for screening mammogram for malignant neoplasm of breast: Secondary | ICD-10-CM

## 2021-07-28 DIAGNOSIS — F4322 Adjustment disorder with anxiety: Secondary | ICD-10-CM | POA: Diagnosis not present

## 2021-08-02 ENCOUNTER — Other Ambulatory Visit (HOSPITAL_COMMUNITY): Payer: Self-pay

## 2021-08-17 ENCOUNTER — Ambulatory Visit
Admission: RE | Admit: 2021-08-17 | Discharge: 2021-08-17 | Disposition: A | Discharge status: Home / Self Care | Payer: 59 | Source: Ambulatory Visit | Attending: Obstetrics & Gynecology | Admitting: Obstetrics & Gynecology

## 2021-08-17 DIAGNOSIS — Z1231 Encounter for screening mammogram for malignant neoplasm of breast: Secondary | ICD-10-CM

## 2021-08-18 DIAGNOSIS — F4322 Adjustment disorder with anxiety: Secondary | ICD-10-CM | POA: Diagnosis not present

## 2021-08-25 DIAGNOSIS — F4322 Adjustment disorder with anxiety: Secondary | ICD-10-CM | POA: Diagnosis not present

## 2021-09-01 DIAGNOSIS — F4322 Adjustment disorder with anxiety: Secondary | ICD-10-CM | POA: Diagnosis not present

## 2021-09-13 DIAGNOSIS — F4322 Adjustment disorder with anxiety: Secondary | ICD-10-CM | POA: Diagnosis not present

## 2021-09-29 DIAGNOSIS — F4322 Adjustment disorder with anxiety: Secondary | ICD-10-CM | POA: Diagnosis not present

## 2021-10-12 DIAGNOSIS — F4322 Adjustment disorder with anxiety: Secondary | ICD-10-CM | POA: Diagnosis not present

## 2021-10-18 DIAGNOSIS — F4322 Adjustment disorder with anxiety: Secondary | ICD-10-CM | POA: Diagnosis not present

## 2021-10-25 ENCOUNTER — Other Ambulatory Visit (HOSPITAL_COMMUNITY): Payer: Self-pay

## 2021-10-25 MED ORDER — AZITHROMYCIN 500 MG PO TABS
500.0000 mg | ORAL_TABLET | Freq: Every day | ORAL | 0 refills | Status: DC
  Filled 2021-10-25: qty 3, 3d supply, fill #0

## 2021-11-06 ENCOUNTER — Other Ambulatory Visit (HOSPITAL_COMMUNITY): Payer: Self-pay

## 2021-11-06 MED ORDER — LEVOTHYROXINE SODIUM 50 MCG PO TABS
50.0000 ug | ORAL_TABLET | Freq: Every day | ORAL | 0 refills | Status: DC
  Filled 2021-11-06: qty 90, 90d supply, fill #0

## 2021-11-09 DIAGNOSIS — F4322 Adjustment disorder with anxiety: Secondary | ICD-10-CM | POA: Diagnosis not present

## 2021-11-10 DIAGNOSIS — F4322 Adjustment disorder with anxiety: Secondary | ICD-10-CM | POA: Diagnosis not present

## 2021-11-16 ENCOUNTER — Other Ambulatory Visit (HOSPITAL_COMMUNITY): Payer: Self-pay

## 2021-11-16 DIAGNOSIS — E039 Hypothyroidism, unspecified: Secondary | ICD-10-CM | POA: Diagnosis not present

## 2021-11-16 DIAGNOSIS — J309 Allergic rhinitis, unspecified: Secondary | ICD-10-CM | POA: Diagnosis not present

## 2021-11-16 DIAGNOSIS — Z0000 Encounter for general adult medical examination without abnormal findings: Secondary | ICD-10-CM | POA: Diagnosis not present

## 2021-11-16 MED ORDER — FLUTICASONE PROPIONATE 50 MCG/ACT NA SUSP
1.0000 | Freq: Every day | NASAL | 5 refills | Status: DC
  Filled 2021-11-16: qty 16, 60d supply, fill #0
  Filled 2022-07-26: qty 16, 60d supply, fill #1
  Filled 2022-09-24: qty 16, 60d supply, fill #2
  Filled 2022-11-12: qty 16, 60d supply, fill #3
  Filled ????-??-??: fill #2

## 2021-11-16 MED ORDER — LEVOTHYROXINE SODIUM 50 MCG PO TABS
50.0000 ug | ORAL_TABLET | Freq: Every morning | ORAL | 3 refills | Status: DC
  Filled 2021-11-16 – 2022-01-31 (×2): qty 90, 90d supply, fill #0
  Filled 2022-04-18: qty 90, 90d supply, fill #1
  Filled 2022-07-26: qty 90, 90d supply, fill #2
  Filled 2022-10-23: qty 90, 90d supply, fill #3

## 2021-12-07 DIAGNOSIS — F4322 Adjustment disorder with anxiety: Secondary | ICD-10-CM | POA: Diagnosis not present

## 2021-12-08 DIAGNOSIS — L720 Epidermal cyst: Secondary | ICD-10-CM | POA: Diagnosis not present

## 2021-12-08 DIAGNOSIS — F4322 Adjustment disorder with anxiety: Secondary | ICD-10-CM | POA: Diagnosis not present

## 2021-12-15 DIAGNOSIS — Z23 Encounter for immunization: Secondary | ICD-10-CM | POA: Diagnosis not present

## 2021-12-15 DIAGNOSIS — L729 Follicular cyst of the skin and subcutaneous tissue, unspecified: Secondary | ICD-10-CM | POA: Diagnosis not present

## 2021-12-15 DIAGNOSIS — L7211 Pilar cyst: Secondary | ICD-10-CM | POA: Diagnosis not present

## 2022-01-05 DIAGNOSIS — F4322 Adjustment disorder with anxiety: Secondary | ICD-10-CM | POA: Diagnosis not present

## 2022-01-31 ENCOUNTER — Other Ambulatory Visit (HOSPITAL_COMMUNITY): Payer: Self-pay

## 2022-02-02 DIAGNOSIS — F4322 Adjustment disorder with anxiety: Secondary | ICD-10-CM | POA: Diagnosis not present

## 2022-02-15 ENCOUNTER — Encounter: Payer: Self-pay | Admitting: Obstetrics & Gynecology

## 2022-02-15 ENCOUNTER — Ambulatory Visit (INDEPENDENT_AMBULATORY_CARE_PROVIDER_SITE_OTHER): Payer: 59 | Admitting: Obstetrics & Gynecology

## 2022-02-15 ENCOUNTER — Other Ambulatory Visit (HOSPITAL_COMMUNITY)
Admission: RE | Admit: 2022-02-15 | Discharge: 2022-02-15 | Disposition: A | Discharge status: Home / Self Care | Payer: 59 | Source: Ambulatory Visit | Attending: Obstetrics & Gynecology | Admitting: Obstetrics & Gynecology

## 2022-02-15 VITALS — BP 112/62 | HR 64 | Ht 63.75 in | Wt 148.0 lb

## 2022-02-15 DIAGNOSIS — Z803 Family history of malignant neoplasm of breast: Secondary | ICD-10-CM

## 2022-02-15 DIAGNOSIS — Z01419 Encounter for gynecological examination (general) (routine) without abnormal findings: Secondary | ICD-10-CM | POA: Diagnosis not present

## 2022-02-15 DIAGNOSIS — Z780 Asymptomatic menopausal state: Secondary | ICD-10-CM | POA: Diagnosis not present

## 2022-02-15 NOTE — Progress Notes (Signed)
Kimberly Braun 1960/11/05 [id number]   History:    61 y.o.  G2P2L2 Married.  Pediatrician.  1 child in HS 1 in college.   RP:  Established patient presenting for annual gyn exam    HPI: Postmenopause, well on no HRT.  Had Myomectomy 06/2017.  No PMB.  Well on no HRT.  No pelvic pain.  No pain with IC. No h/o abnormal Pap.  Pap Neg 07/2017.  Pap reflex today. Breasts normal.  Mammo Neg 08/2021.  Sister, late 41's, Dxed with Breast Ca, BrCa1-2 Neg.  BMI 25.6.  Exercising regularly, swimming/hiking.  Health Labs with Fam Braun.  Hypothyroidism on Synthroid.  Colonoscopy 05/2019 with Dr Collene Mares.   Past medical history,surgical history, family history and social history were all reviewed and documented in the EPIC chart.  Gynecologic History Patient's last menstrual period was 06/29/2017 (exact date).  Obstetric History OB History  Gravida Para Term Preterm AB Living  '2 2 2     2  ' SAB IAB Ectopic Multiple Live Births               # Outcome Date GA Lbr Len/2nd Weight Sex Delivery Anes PTL Lv  2 Term           1 Term              ROS: A ROS was performed and pertinent positives and negatives are included in the history.  GENERAL: No fevers or chills. HEENT: No change in vision, no earache, sore throat or sinus congestion. NECK: No pain or stiffness. CARDIOVASCULAR: No chest pain or pressure. No palpitations. PULMONARY: No shortness of breath, cough or wheeze. GASTROINTESTINAL: No abdominal pain, nausea, vomiting or diarrhea, melena or bright red blood per rectum. GENITOURINARY: No urinary frequency, urgency, hesitancy or dysuria. MUSCULOSKELETAL: No joint or muscle pain, no back pain, no recent trauma. DERMATOLOGIC: No rash, no itching, no lesions. ENDOCRINE: No polyuria, polydipsia, no heat or cold intolerance. No recent change in weight. HEMATOLOGICAL: No anemia or easy bruising or bleeding. NEUROLOGIC: No headache, seizures, numbness, tingling or weakness. PSYCHIATRIC: No depression, no loss  of interest in normal activity or change in sleep pattern.     Exam:   BP 112/62   Pulse 64   Ht 5' 3.75" (1.619 m)   Wt 148 lb (67.1 kg)   LMP 06/29/2017 (Exact Date)   SpO2 96%   BMI 25.60 kg/m   Body mass index is 25.6 kg/m.  General appearance : Well developed well nourished female. No acute distress HEENT: Eyes: no retinal hemorrhage or exudates,  Neck supple, trachea midline, no carotid bruits, no thyroidmegaly Lungs: Clear to auscultation, no rhonchi or wheezes, or rib retractions  Heart: Regular rate and rhythm, no murmurs or gallops Breast:Examined in sitting and supine position were symmetrical in appearance, no palpable masses or tenderness,  no skin retraction, no nipple inversion, no nipple discharge, no skin discoloration, no axillary or supraclavicular lymphadenopathy Abdomen: no palpable masses or tenderness, no rebound or guarding Extremities: no edema or skin discoloration or tenderness  Pelvic: Vulva: Normal             Vagina: No gross lesions or discharge  Cervix: No gross lesions or discharge.  Pap reflex done.  Uterus  AV, normal size, shape and consistency, non-tender and mobile  Adnexa  Without masses or tenderness  Anus: Normal   Assessment/Plan:  61 y.o. female for annual exam   1. Encounter for routine gynecological examination with Papanicolaou  smear of cervix Postmenopause, well on no HRT.  Had Myomectomy 06/2017.  No PMB.  Well on no HRT.  No pelvic pain.  No pain with IC. No h/o abnormal Pap.  Pap Neg 07/2017.  Pap reflex today. Breasts normal.  Mammo Neg 08/2021.  Sister, late 33's, Dxed with Breast Ca, BrCa1-2 Neg.  BMI 25.6.  Exercising regularly, swimming/hiking.  Health Labs with Fam Braun.  Hypothyroidism on Synthroid. Colonoscopy 05/2019 with Dr Collene Mares. - Cytology - PAP( [place])  2. Postmenopause Postmenopause, well on no HRT.  Had Myomectomy 06/2017.  No PMB. No pelvic pain.  No pain with IC.  Recommend Vit D supplement, Ca++ total  1.2-1.5 g/d, regular weight bearing activities.  3. Family history of breast cancer in sister Postmenopausal Breast Ca in sister with BrCa1-2 Neg.    Other orders - Cholecalciferol (VITAMIN D) 50 MCG (2000 UT) tablet; 1 tablet   Kimberly Braun, 9:14 AM 02/15/2022

## 2022-02-19 LAB — CYTOLOGY - PAP: Diagnosis: NEGATIVE

## 2022-03-02 DIAGNOSIS — F4322 Adjustment disorder with anxiety: Secondary | ICD-10-CM | POA: Diagnosis not present

## 2022-04-18 ENCOUNTER — Other Ambulatory Visit (HOSPITAL_COMMUNITY): Payer: Self-pay

## 2022-04-25 ENCOUNTER — Other Ambulatory Visit (HOSPITAL_COMMUNITY): Payer: Self-pay

## 2022-04-25 MED ORDER — AZITHROMYCIN 250 MG PO TABS
ORAL_TABLET | ORAL | 0 refills | Status: DC
  Filled 2022-04-25: qty 2, 1d supply, fill #0

## 2022-04-26 ENCOUNTER — Other Ambulatory Visit (HOSPITAL_COMMUNITY): Payer: Self-pay

## 2022-05-11 DIAGNOSIS — H5213 Myopia, bilateral: Secondary | ICD-10-CM | POA: Diagnosis not present

## 2022-05-11 DIAGNOSIS — H43391 Other vitreous opacities, right eye: Secondary | ICD-10-CM | POA: Diagnosis not present

## 2022-05-11 DIAGNOSIS — H35371 Puckering of macula, right eye: Secondary | ICD-10-CM | POA: Diagnosis not present

## 2022-05-11 DIAGNOSIS — H43811 Vitreous degeneration, right eye: Secondary | ICD-10-CM | POA: Diagnosis not present

## 2022-05-11 DIAGNOSIS — H25813 Combined forms of age-related cataract, bilateral: Secondary | ICD-10-CM | POA: Diagnosis not present

## 2022-05-11 DIAGNOSIS — H33311 Horseshoe tear of retina without detachment, right eye: Secondary | ICD-10-CM | POA: Diagnosis not present

## 2022-05-11 DIAGNOSIS — H524 Presbyopia: Secondary | ICD-10-CM | POA: Diagnosis not present

## 2022-05-16 ENCOUNTER — Other Ambulatory Visit (HOSPITAL_COMMUNITY): Payer: Self-pay

## 2022-05-16 MED ORDER — ALBUTEROL SULFATE HFA 108 (90 BASE) MCG/ACT IN AERS
2.0000 | INHALATION_SPRAY | RESPIRATORY_TRACT | 0 refills | Status: DC | PRN
  Filled 2022-05-16: qty 6.7, 16d supply, fill #0

## 2022-05-18 DIAGNOSIS — H33311 Horseshoe tear of retina without detachment, right eye: Secondary | ICD-10-CM | POA: Diagnosis not present

## 2022-05-24 DIAGNOSIS — E039 Hypothyroidism, unspecified: Secondary | ICD-10-CM | POA: Diagnosis not present

## 2022-06-15 DIAGNOSIS — H35373 Puckering of macula, bilateral: Secondary | ICD-10-CM | POA: Diagnosis not present

## 2022-06-15 DIAGNOSIS — H35432 Paving stone degeneration of retina, left eye: Secondary | ICD-10-CM | POA: Diagnosis not present

## 2022-06-15 DIAGNOSIS — H43391 Other vitreous opacities, right eye: Secondary | ICD-10-CM | POA: Diagnosis not present

## 2022-06-15 DIAGNOSIS — H33101 Unspecified retinoschisis, right eye: Secondary | ICD-10-CM | POA: Diagnosis not present

## 2022-06-15 DIAGNOSIS — H25813 Combined forms of age-related cataract, bilateral: Secondary | ICD-10-CM | POA: Diagnosis not present

## 2022-06-15 DIAGNOSIS — H43811 Vitreous degeneration, right eye: Secondary | ICD-10-CM | POA: Diagnosis not present

## 2022-06-20 ENCOUNTER — Other Ambulatory Visit (HOSPITAL_COMMUNITY): Payer: Self-pay

## 2022-06-20 MED ORDER — AMOXICILLIN 875 MG PO TABS
875.0000 mg | ORAL_TABLET | Freq: Two times a day (BID) | ORAL | 0 refills | Status: DC
  Filled 2022-06-20: qty 40, 20d supply, fill #0

## 2022-06-21 ENCOUNTER — Other Ambulatory Visit (HOSPITAL_COMMUNITY): Payer: Self-pay

## 2022-07-03 ENCOUNTER — Other Ambulatory Visit (HOSPITAL_COMMUNITY): Payer: Self-pay

## 2022-07-03 MED ORDER — CHLORHEXIDINE GLUCONATE 0.12 % MT SOLN
15.0000 mL | Freq: Two times a day (BID) | OROMUCOSAL | 0 refills | Status: DC
  Filled 2022-07-03: qty 473, 16d supply, fill #0

## 2022-07-05 ENCOUNTER — Other Ambulatory Visit (HOSPITAL_COMMUNITY): Payer: Self-pay

## 2022-07-05 MED ORDER — AMOXICILLIN 875 MG PO TABS
875.0000 mg | ORAL_TABLET | Freq: Two times a day (BID) | ORAL | 0 refills | Status: DC
  Filled 2022-07-05: qty 28, 7d supply, fill #0
  Filled 2022-12-20: qty 28, 14d supply, fill #0

## 2022-07-06 ENCOUNTER — Other Ambulatory Visit (HOSPITAL_COMMUNITY): Payer: Self-pay

## 2022-07-20 ENCOUNTER — Other Ambulatory Visit (HOSPITAL_COMMUNITY): Payer: Self-pay

## 2022-08-30 ENCOUNTER — Other Ambulatory Visit (HOSPITAL_COMMUNITY): Payer: Self-pay

## 2022-09-18 ENCOUNTER — Other Ambulatory Visit: Payer: Self-pay | Admitting: Obstetrics & Gynecology

## 2022-09-18 DIAGNOSIS — Z1231 Encounter for screening mammogram for malignant neoplasm of breast: Secondary | ICD-10-CM

## 2022-10-24 ENCOUNTER — Other Ambulatory Visit (HOSPITAL_COMMUNITY): Payer: Self-pay

## 2022-11-01 ENCOUNTER — Ambulatory Visit
Admission: RE | Admit: 2022-11-01 | Discharge: 2022-11-01 | Disposition: A | Discharge status: Home / Self Care | Payer: Commercial Managed Care - PPO | Source: Ambulatory Visit | Attending: Obstetrics & Gynecology | Admitting: Obstetrics & Gynecology

## 2022-11-01 DIAGNOSIS — Z1231 Encounter for screening mammogram for malignant neoplasm of breast: Secondary | ICD-10-CM

## 2022-11-08 ENCOUNTER — Other Ambulatory Visit (HOSPITAL_COMMUNITY): Payer: Self-pay

## 2022-11-08 MED ORDER — AZITHROMYCIN 500 MG PO TABS
500.0000 mg | ORAL_TABLET | Freq: Once | ORAL | 0 refills | Status: AC
  Filled 2022-11-08: qty 1, 1d supply, fill #0

## 2022-11-12 ENCOUNTER — Other Ambulatory Visit (HOSPITAL_COMMUNITY): Payer: Self-pay

## 2022-11-29 ENCOUNTER — Other Ambulatory Visit (HOSPITAL_COMMUNITY): Payer: Self-pay

## 2022-11-29 DIAGNOSIS — E039 Hypothyroidism, unspecified: Secondary | ICD-10-CM | POA: Diagnosis not present

## 2022-11-29 DIAGNOSIS — Z0000 Encounter for general adult medical examination without abnormal findings: Secondary | ICD-10-CM | POA: Diagnosis not present

## 2022-11-29 DIAGNOSIS — J309 Allergic rhinitis, unspecified: Secondary | ICD-10-CM | POA: Diagnosis not present

## 2022-11-29 DIAGNOSIS — E7800 Pure hypercholesterolemia, unspecified: Secondary | ICD-10-CM | POA: Diagnosis not present

## 2022-11-29 DIAGNOSIS — Z23 Encounter for immunization: Secondary | ICD-10-CM | POA: Diagnosis not present

## 2022-11-29 MED ORDER — LEVOTHYROXINE SODIUM 50 MCG PO TABS
50.0000 ug | ORAL_TABLET | Freq: Every day | ORAL | 3 refills | Status: DC
  Filled 2022-11-29 – 2023-01-22 (×2): qty 90, 90d supply, fill #0
  Filled 2023-04-18: qty 90, 90d supply, fill #1
  Filled 2023-07-23: qty 90, 90d supply, fill #2
  Filled 2023-10-20: qty 90, 90d supply, fill #3

## 2022-11-29 MED ORDER — FLUTICASONE PROPIONATE 50 MCG/ACT NA SUSP
1.0000 | Freq: Every day | NASAL | 5 refills | Status: DC
  Filled 2022-11-29: qty 48, 90d supply, fill #0
  Filled 2023-01-23: qty 16, 30d supply, fill #0
  Filled 2023-04-18: qty 16, 30d supply, fill #1
  Filled 2023-07-23: qty 16, 30d supply, fill #2
  Filled 2023-10-20: qty 16, 30d supply, fill #3

## 2022-12-20 ENCOUNTER — Other Ambulatory Visit (HOSPITAL_COMMUNITY): Payer: Self-pay

## 2022-12-20 MED ORDER — AMOXICILLIN 875 MG PO TABS
875.0000 mg | ORAL_TABLET | Freq: Two times a day (BID) | ORAL | 0 refills | Status: DC
  Filled 2022-12-20: qty 14, 7d supply, fill #0

## 2022-12-20 MED ORDER — ALBUTEROL SULFATE HFA 108 (90 BASE) MCG/ACT IN AERS
2.0000 | INHALATION_SPRAY | RESPIRATORY_TRACT | 0 refills | Status: DC | PRN
  Filled 2022-12-20: qty 6.7, 16d supply, fill #0

## 2023-01-22 ENCOUNTER — Other Ambulatory Visit (HOSPITAL_COMMUNITY): Payer: Self-pay

## 2023-01-23 ENCOUNTER — Other Ambulatory Visit (HOSPITAL_COMMUNITY): Payer: Self-pay

## 2023-03-22 IMAGING — MG MM DIGITAL SCREENING BILAT W/ TOMO AND CAD
8 series · 9 of 24 positions shown · non-contrast
Comparison: Previous exam(s).

CLINICAL DATA: Screening.

EXAM:
DIGITAL SCREENING BILATERAL MAMMOGRAM WITH TOMOSYNTHESIS AND CAD
TECHNIQUE: Bilateral screening digital craniocaudal and mediolateral oblique
mammograms were obtained. Bilateral screening digital breast
tomosynthesis was performed. The images were evaluated with
computer-aided detection.

[R MLO synth-2D]
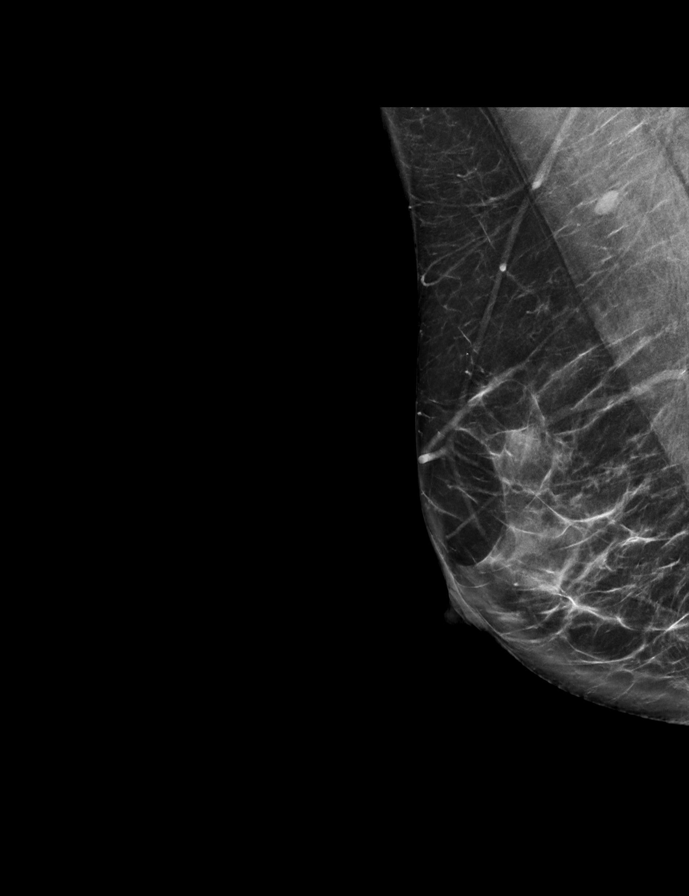

[L CC synth-2D]
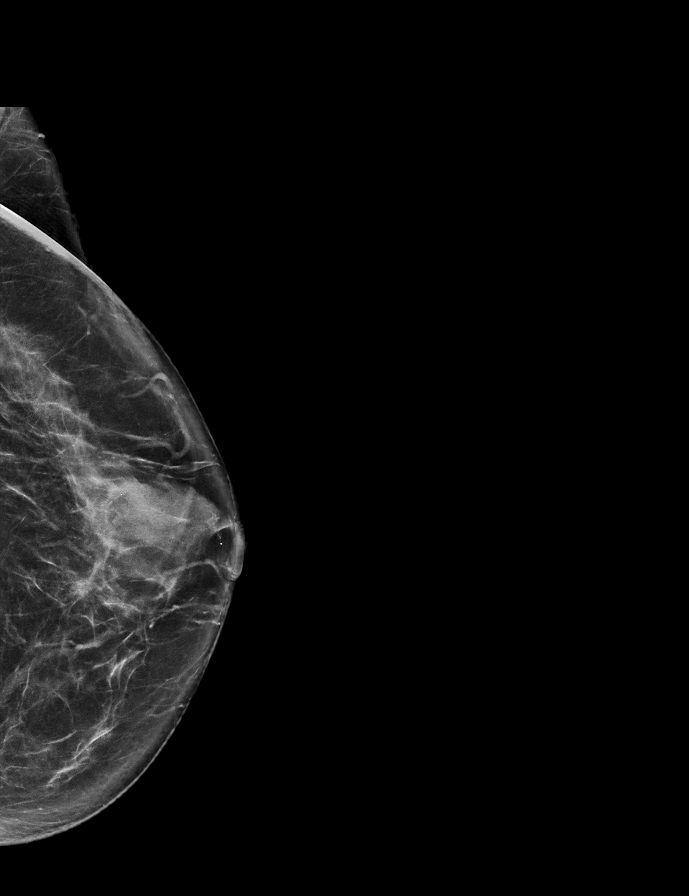

[R CC synth-2D]
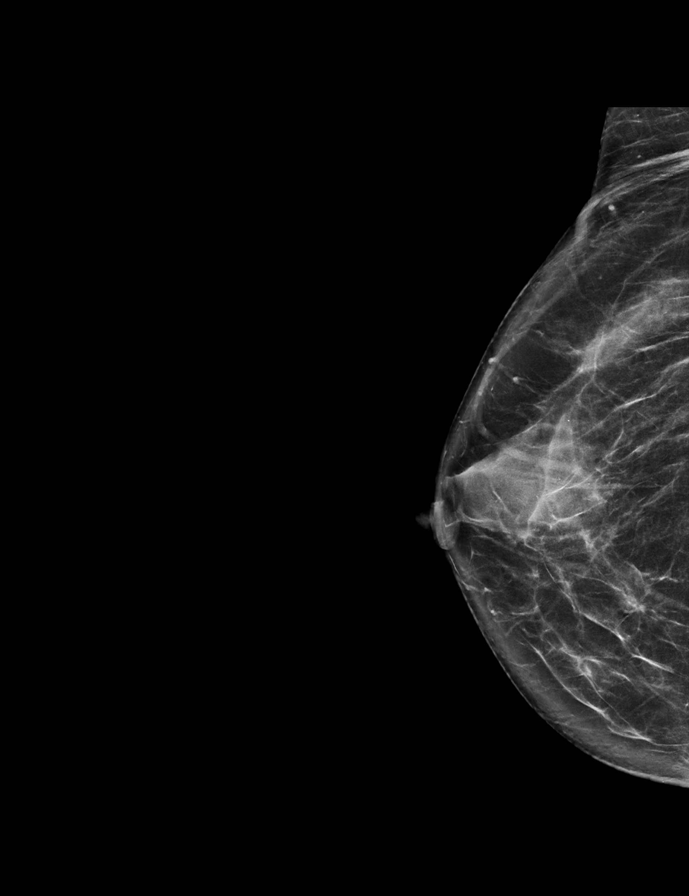

[L MLO synth-2D]
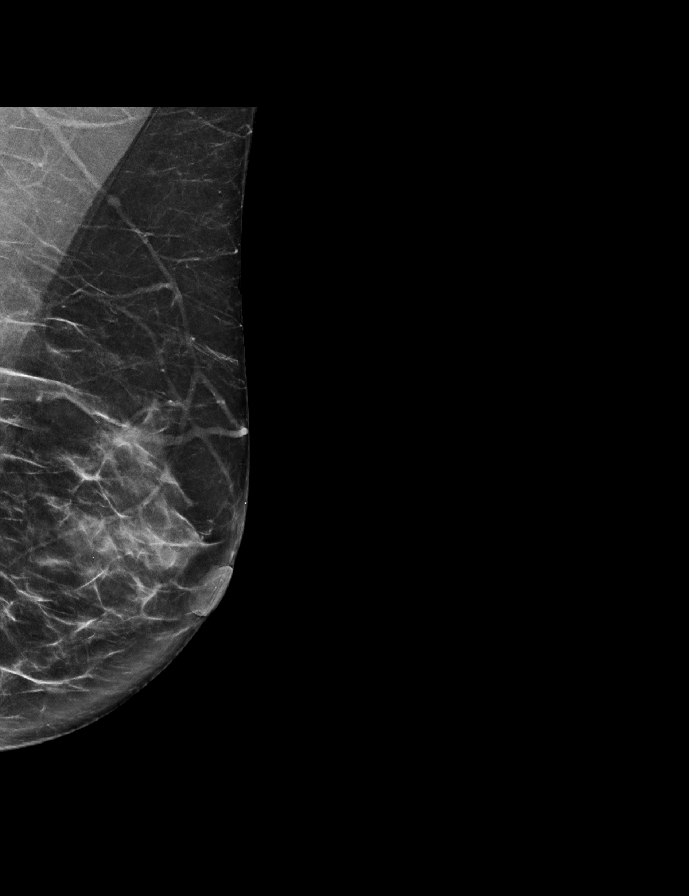

[L MLO tomo · 2 of 65 frames shown]
[frame 21/65]
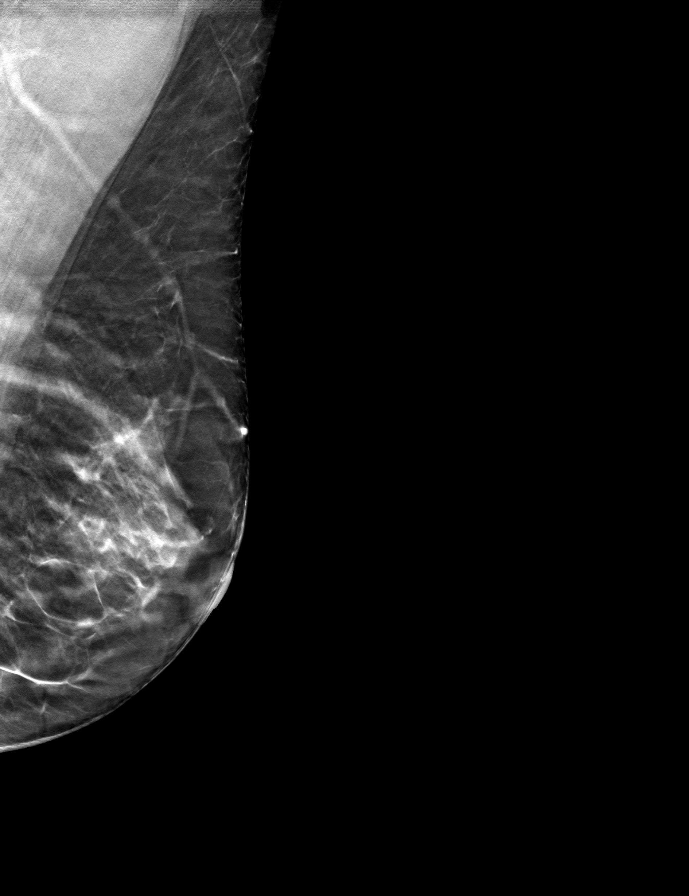
[frame 33/65]
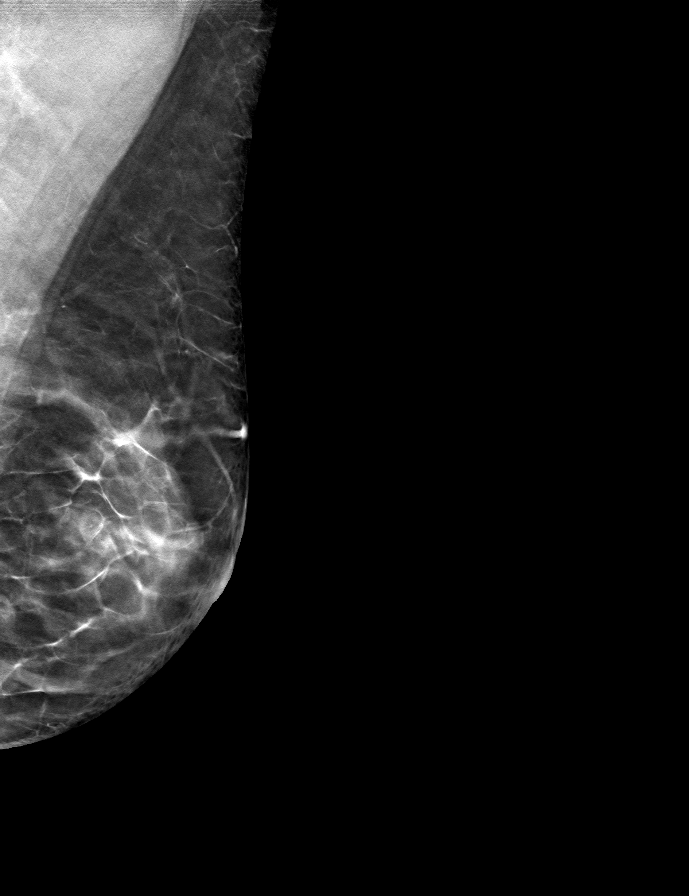

[R MLO tomo · tomo slice 35/70.0]
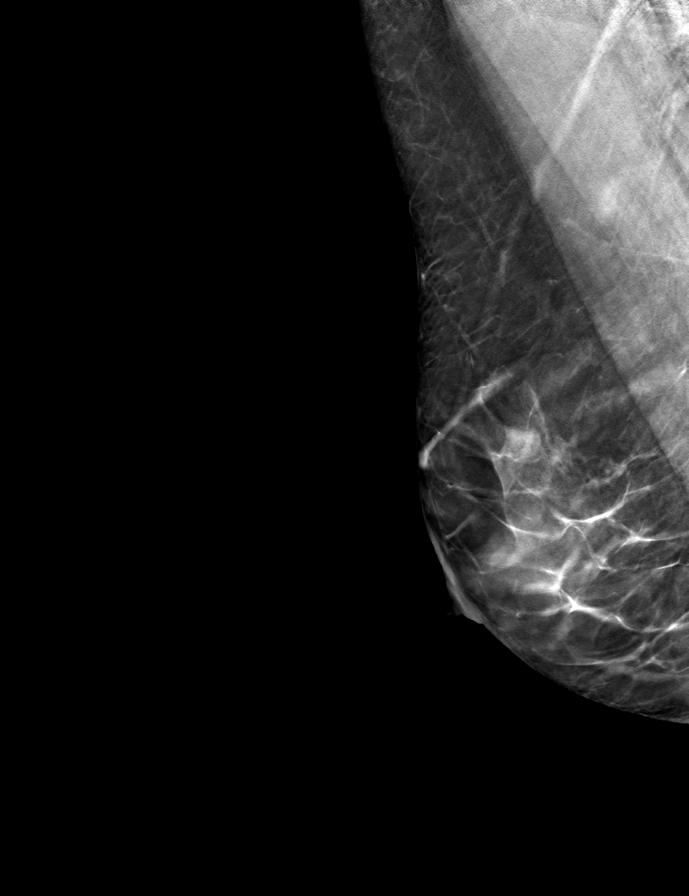

[L CC tomo · tomo slice 35/69.0]
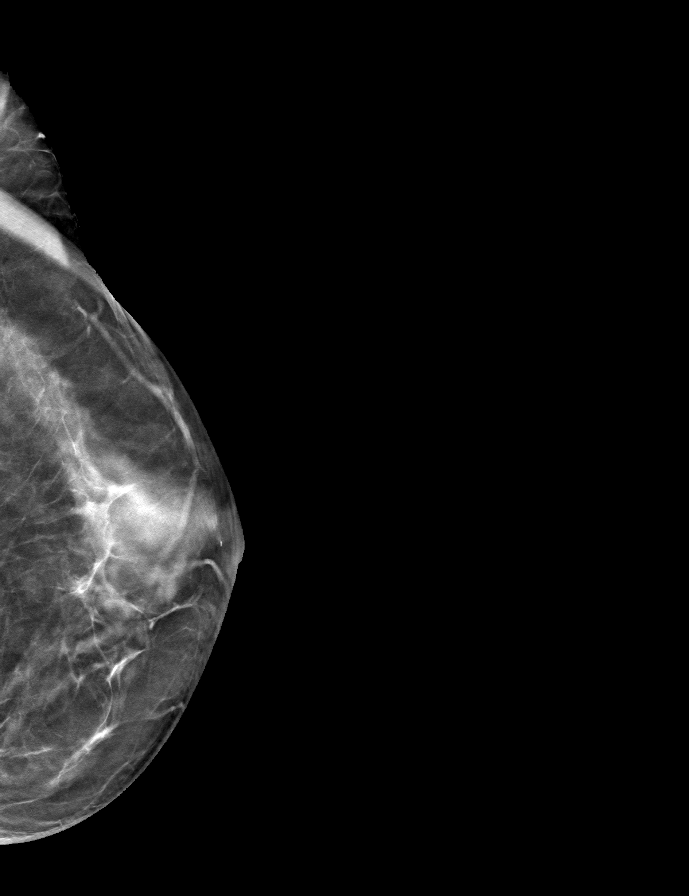

[R CC tomo · tomo slice 35/68.0]
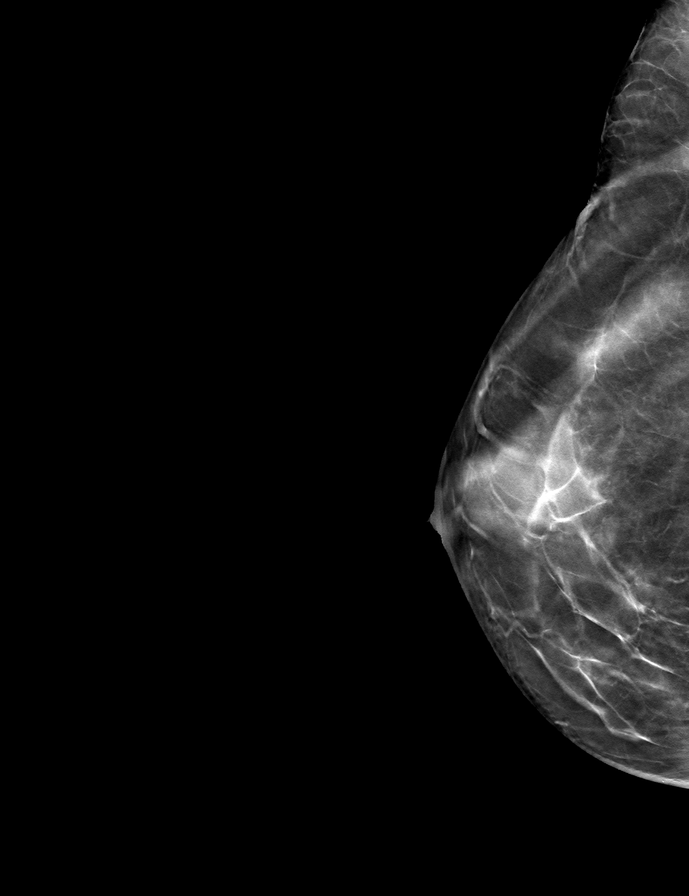

[9 of 24 positions shown; findings below may reference images not displayed]

ACR Breast Density Category c: The breast tissue is heterogeneously
dense, which may obscure small masses.
FINDINGS: There are no findings suspicious for malignancy.
IMPRESSION: No mammographic evidence of malignancy. A result letter of this
screening mammogram will be mailed directly to the patient.

RECOMMENDATION:
Screening mammogram in one year. (Code:Q3-W-BC3)

BI-RADS CATEGORY  1: Negative.

## 2023-05-24 DIAGNOSIS — H52203 Unspecified astigmatism, bilateral: Secondary | ICD-10-CM | POA: Diagnosis not present

## 2023-05-24 DIAGNOSIS — H5213 Myopia, bilateral: Secondary | ICD-10-CM | POA: Diagnosis not present

## 2023-06-21 DIAGNOSIS — H2513 Age-related nuclear cataract, bilateral: Secondary | ICD-10-CM | POA: Diagnosis not present

## 2023-06-21 DIAGNOSIS — H35373 Puckering of macula, bilateral: Secondary | ICD-10-CM | POA: Diagnosis not present

## 2023-06-21 DIAGNOSIS — H43811 Vitreous degeneration, right eye: Secondary | ICD-10-CM | POA: Diagnosis not present

## 2023-09-30 ENCOUNTER — Other Ambulatory Visit: Payer: Self-pay | Admitting: Obstetrics & Gynecology

## 2023-09-30 DIAGNOSIS — Z1231 Encounter for screening mammogram for malignant neoplasm of breast: Secondary | ICD-10-CM

## 2023-10-20 ENCOUNTER — Other Ambulatory Visit (HOSPITAL_COMMUNITY): Payer: Self-pay

## 2023-10-21 ENCOUNTER — Other Ambulatory Visit (HOSPITAL_COMMUNITY): Payer: Self-pay

## 2023-10-21 ENCOUNTER — Other Ambulatory Visit: Payer: Self-pay

## 2023-10-21 MED ORDER — ALBUTEROL SULFATE HFA 108 (90 BASE) MCG/ACT IN AERS
2.0000 | INHALATION_SPRAY | RESPIRATORY_TRACT | 0 refills | Status: DC | PRN
Start: 2023-10-21 — End: 2023-12-05
  Filled 2023-10-21: qty 6.7, 17d supply, fill #0

## 2023-10-23 ENCOUNTER — Ambulatory Visit
Admission: RE | Admit: 2023-10-23 | Discharge: 2023-10-23 | Disposition: A | Discharge status: Home / Self Care | Source: Ambulatory Visit | Attending: Family Medicine | Admitting: Family Medicine

## 2023-10-23 ENCOUNTER — Encounter: Payer: Self-pay | Admitting: Family Medicine

## 2023-10-23 ENCOUNTER — Ambulatory Visit: Admitting: Family Medicine

## 2023-10-23 ENCOUNTER — Ambulatory Visit: Payer: Self-pay

## 2023-10-23 VITALS — BP 136/70 | Ht 64.0 in | Wt 150.0 lb

## 2023-10-23 DIAGNOSIS — M546 Pain in thoracic spine: Secondary | ICD-10-CM

## 2023-10-23 DIAGNOSIS — M549 Dorsalgia, unspecified: Secondary | ICD-10-CM | POA: Diagnosis not present

## 2023-10-23 NOTE — Progress Notes (Signed)
 PCP: Maurice Small, MD (Inactive)  Subjective:   HPI: Patient is a 63 y.o. female here for thoracic pain.  Patient was skiing about 6 weeks ago when she had a hard fall. Caused immediate pain in right anterior ribcage and mid-low thoracic spine more to the left side. Ribs anteriorly improved after about 4 weeks. However the pain in left mid-back has continued. Able to sleep and improving if lying flat on back. Worse with extension and with seated. Avoiding twisting maneuvers. Had some initial tingling/numbness but this has resolved. Overall feels ok with activity but worse afterwards and at night. Has not been seen for this to date.  Past Medical History:  Diagnosis Date   Fibroid    VAGINA PROLAPSED   Iron deficiency anemia    Menorrhagia    Wears glasses     Current Outpatient Medications on File Prior to Visit  Medication Sig Dispense Refill   albuterol (VENTOLIN HFA) 108 (90 Base) MCG/ACT inhaler Inhale 2 puffs by mouth every 4 hours as  needed for wheezing 8.5 g 5   albuterol (VENTOLIN HFA) 108 (90 Base) MCG/ACT inhaler Inhale 2 puffs into the lungs every 4 (four) hours as needed for wheezing 6.7 g 0   amoxicillin (AMOXIL) 875 MG tablet Take 1 tablet (875 mg total) by mouth 2 (two) times daily. 28 tablet 0   amoxicillin (AMOXIL) 875 MG tablet Take 1 tablet (875 mg total) by mouth 2 (two) times daily. 14 tablet 0   azithromycin (ZITHROMAX) 250 MG tablet Take 2 tablets  1 hour prior to surgery. 2 tablet 0   chlorhexidine (PERIDEX) 0.12 % solution GENTLY RINSE WITH  for 2 minutes twice a day. Spit out after use. 473 mL 0   Cholecalciferol (VITAMIN D) 50 MCG (2000 UT) tablet 1 tablet     fluticasone (FLONASE ALLERGY RELIEF) 50 MCG/ACT nasal spray Place 1 spray into both nostrils daily. 48 g 5   levothyroxine (SYNTHROID) 50 MCG tablet take 1 tablet ( ) by mouth daily on an empty stomach in the morning 90 tablet 3   No current facility-administered medications on  file prior to visit.    Past Surgical History:  Procedure Laterality Date   DILATATION & CURETTAGE/HYSTEROSCOPY WITH MYOSURE N/A 10/26/2015   Procedure: DILATATION & CURETTAGE/HYSTEROSCOPY WITH MYOSURE;  Surgeon: Genia Del, MD;  Location: [facility];  Service: Gynecology;  Laterality: N/A;   MYOMECTOMY N/A 07/05/2017   Procedure: VAGINAL MYOMECTOMY, EXCISION OF FIBROID;  Surgeon: Genia Del, MD;  Location: [facility];  Service: Gynecology;  Laterality: N/A;  request to follow Dr. Sharol Roussel 2nd case at around 10am. requests one hour   WISDOM TOOTH EXTRACTION      No Known Allergies  BP 136/70   Ht 5\' 4"  (1.626 m)   Wt 150 lb (68 kg)   LMP 06/29/2017 (Exact Date)   BMI 25.75 kg/m       No data to display              No data to display              Objective:  Physical Exam:  Gen: NAD, comfortable in exam room  Back/ribs: No deformity, swelling, bruising.  No crepitus or palpable stepoff Mild tenderness to palpation left mid-low thoracic paraspinal region.  No spinous process tenderness. NVI distally.  Limited MSK u/s left thoracic area:  No visible rib fractures of ribs 6-10, callus, or other abnormalities.  Rib sliding visualized.   Assessment &  Plan:  1. Mid-back pain - from fall when skiing on 2/21.  Ultrasound reassuring.  Concern for possible thoracic compression fracture.  Will proceed with radiographs.  If these are normal treat for intercostal and paraspinal thoracic strains - heat, consider physical therapy, tylenol/aleve if needed, exercise as tolerated.

## 2023-10-23 NOTE — Patient Instructions (Addendum)
 Get x-rays of your thoracic spine - we will contact you with the results. This will also visualize the ribs in the area though ultrasound was also reassuring against rib fracture. Heat 15 minutes at a time as needed, ice after exercise. Assuming the x-rays are normal I'd consider physical therapy, exercise as tolerated. Tylenol, aleve only if needed but we will talk after your x-rays.

## 2023-10-28 ENCOUNTER — Encounter: Payer: Self-pay | Admitting: Family Medicine

## 2023-10-28 NOTE — Progress Notes (Signed)
 Xrays reviewed for Dr Peggy Bowens who is on PTO.  No compression fx.  Should proceed with tx as directed by Dr Peggy Bowens from visit 10/23/23.  MyChart message sent.

## 2023-11-06 ENCOUNTER — Ambulatory Visit: Admitting: Orthopaedic Surgery

## 2023-12-04 ENCOUNTER — Ambulatory Visit: Admitting: Obstetrics and Gynecology

## 2023-12-05 ENCOUNTER — Other Ambulatory Visit (HOSPITAL_COMMUNITY): Payer: Self-pay

## 2023-12-05 ENCOUNTER — Other Ambulatory Visit (HOSPITAL_BASED_OUTPATIENT_CLINIC_OR_DEPARTMENT_OTHER): Payer: Self-pay | Admitting: Internal Medicine

## 2023-12-05 DIAGNOSIS — Z136 Encounter for screening for cardiovascular disorders: Secondary | ICD-10-CM

## 2023-12-05 DIAGNOSIS — Z23 Encounter for immunization: Secondary | ICD-10-CM | POA: Diagnosis not present

## 2023-12-05 DIAGNOSIS — Z0000 Encounter for general adult medical examination without abnormal findings: Secondary | ICD-10-CM | POA: Diagnosis not present

## 2023-12-05 DIAGNOSIS — E039 Hypothyroidism, unspecified: Secondary | ICD-10-CM | POA: Diagnosis not present

## 2023-12-05 DIAGNOSIS — J309 Allergic rhinitis, unspecified: Secondary | ICD-10-CM | POA: Diagnosis not present

## 2023-12-05 DIAGNOSIS — E7800 Pure hypercholesterolemia, unspecified: Secondary | ICD-10-CM | POA: Diagnosis not present

## 2023-12-05 DIAGNOSIS — J4520 Mild intermittent asthma, uncomplicated: Secondary | ICD-10-CM | POA: Diagnosis not present

## 2023-12-05 MED ORDER — LEVOTHYROXINE SODIUM 50 MCG PO TABS
50.0000 ug | ORAL_TABLET | Freq: Every morning | ORAL | 3 refills | Status: AC
  Filled 2023-12-06 – 2024-01-20 (×2): qty 90, 90d supply, fill #0
  Filled 2024-04-15: qty 90, 90d supply, fill #1
  Filled 2024-07-14: qty 90, 90d supply, fill #2

## 2023-12-05 MED ORDER — FLUTICASONE PROPIONATE 50 MCG/ACT NA SUSP
1.0000 | Freq: Every day | NASAL | 5 refills | Status: AC
  Filled 2023-12-05: qty 16, 60d supply, fill #0
  Filled 2024-03-04: qty 16, 60d supply, fill #1
  Filled 2024-07-14: qty 16, 60d supply, fill #2

## 2023-12-05 MED ORDER — ALBUTEROL SULFATE HFA 108 (90 BASE) MCG/ACT IN AERS
1.0000 | INHALATION_SPRAY | RESPIRATORY_TRACT | 1 refills | Status: AC | PRN
  Filled 2023-12-05: qty 6.7, 17d supply, fill #0

## 2023-12-06 ENCOUNTER — Other Ambulatory Visit (HOSPITAL_COMMUNITY): Payer: Self-pay

## 2023-12-30 ENCOUNTER — Other Ambulatory Visit (HOSPITAL_BASED_OUTPATIENT_CLINIC_OR_DEPARTMENT_OTHER)

## 2023-12-30 ENCOUNTER — Encounter (HOSPITAL_BASED_OUTPATIENT_CLINIC_OR_DEPARTMENT_OTHER): Payer: Self-pay

## 2024-01-03 ENCOUNTER — Ambulatory Visit
Admission: RE | Admit: 2024-01-03 | Discharge: 2024-01-03 | Disposition: A | Discharge status: Home / Self Care | Source: Ambulatory Visit | Attending: Internal Medicine | Admitting: Internal Medicine

## 2024-01-03 DIAGNOSIS — Z1231 Encounter for screening mammogram for malignant neoplasm of breast: Secondary | ICD-10-CM

## 2024-01-07 ENCOUNTER — Ambulatory Visit: Payer: Self-pay | Admitting: Obstetrics and Gynecology

## 2024-01-08 NOTE — Progress Notes (Signed)
 63 y.o. G32P2002 female here for annual exam. Married.  Patient's last menstrual period was 06/29/2017 (exact date).    She reports ***.  Abnormal bleeding: *** Pelvic discharge or pain: *** Breast mass, nipple discharge or skin changes : ***  Sexually active: *** Birth control: Postmenopause Last PAP:     Component Value Date/Time   DIAGPAP  02/15/2022 0933    - Negative for intraepithelial lesion or malignancy (NILM)   ADEQPAP  02/15/2022 0933    Satisfactory for evaluation; transformation zone component PRESENT.   Last mammogram: 01/03/24 BI-RADS 1 Neg Last colonoscopy: 05/2019 ***  Exercising: *** Smoker: ***       GYN HISTORY: ***  OB History  Gravida Para Term Preterm AB Living  2 2 2   2   SAB IAB Ectopic Multiple Live Births          # Outcome Date GA Lbr Len/2nd Weight Sex Type Anes PTL Lv  2 Term           1 Term            Past Medical History:  Diagnosis Date   Fibroid    VAGINA PROLAPSED   Iron deficiency anemia    Menorrhagia    Wears glasses    Past Surgical History:  Procedure Laterality Date   DILATATION & CURETTAGE/HYSTEROSCOPY WITH MYOSURE N/A 10/26/2015   Procedure: DILATATION & CURETTAGE/HYSTEROSCOPY WITH MYOSURE;  Surgeon: Percilla Burly, MD;  Location: [facility];  Service: Gynecology;  Laterality: N/A;   MYOMECTOMY N/A 07/05/2017   Procedure: VAGINAL MYOMECTOMY, EXCISION OF FIBROID;  Surgeon: Lavoie, Marie-Lyne, MD;  Location: [facility];  Service: Gynecology;  Laterality: N/A;  request to follow Dr. [place] 2nd case at around 10am. requests one hour   WISDOM TOOTH EXTRACTION     Current Outpatient Medications on File Prior to Visit  Medication Sig Dispense Refill   albuterol  (VENTOLIN  HFA) 108 (90 Base) MCG/ACT inhaler Inhale 2 puffs by mouth every 4 hours as  needed for wheezing 8.5 g 5   albuterol  (VENTOLIN  HFA) 108 (90 Base) MCG/ACT inhaler Inhale 1 puff into the lungs every 4 (four) hours as needed for  shortness of breath and wheezing. 6.7 g 1   amoxicillin  (AMOXIL ) 875 MG tablet Take 1 tablet (875 mg total) by mouth 2 (two) times daily. 28 tablet 0   amoxicillin  (AMOXIL ) 875 MG tablet Take 1 tablet (875 mg total) by mouth 2 (two) times daily. 14 tablet 0   azithromycin  (ZITHROMAX ) 250 MG tablet Take 2 tablets  1 hour prior to surgery. 2 tablet 0   chlorhexidine  (PERIDEX ) 0.12 % solution GENTLY RINSE WITH 15mls  for 2 minutes twice a day. Spit out after use. 473 mL 0   Cholecalciferol (VITAMIN D) 50 MCG (2000 UT) tablet 1 tablet     fluticasone  (FLONASE  ALLERGY RELIEF) 50 MCG/ACT nasal spray Place 1 spray into both nostrils daily. 48 g 5   levothyroxine  (SYNTHROID ) 50 MCG tablet Take 1 tablet (50 mcg total) by mouth every morning on an empty stomach. 90 tablet 3   No current facility-administered medications on file prior to visit.   Social History   Socioeconomic History   Marital status: Married    Spouse name: Not on file   Number of children: Not on file   Years of education: Not on file   Highest education level: Not on file  Occupational History   Not on file  Tobacco Use   Smoking status:  Never   Smokeless tobacco: Never  Vaping Use   Vaping status: Never Used  Substance and Sexual Activity   Alcohol use: Yes    Comment: occassional   Drug use: No   Sexual activity: Yes    Partners: Male    Birth control/protection: Post-menopausal    Comment: 1st intercourse- 63, partners- 4  Other Topics Concern   Not on file  Social History Narrative   Not on file   Social Drivers of Health   Financial Resource Strain: Not on file  Food Insecurity: Not on file  Transportation Needs: Not on file  Physical Activity: Not on file  Stress: Not on file  Social Connections: Not on file  Intimate Partner Violence: Not on file   Family History  Problem Relation Age of Onset   Hypertension Mother    Breast cancer Sister    Breast cancer Paternal Aunt    Cancer Maternal  Grandfather        colon   No Known Allergies   PE There were no vitals filed for this visit. There is no height or weight on file to calculate BMI.  Physical Exam    Assessment and Plan:        There are no diagnoses linked to this encounter. Clotilda FORBES Pa, CMA

## 2024-01-09 ENCOUNTER — Ambulatory Visit: Admitting: Obstetrics and Gynecology

## 2024-01-09 ENCOUNTER — Encounter: Payer: Self-pay | Admitting: Obstetrics and Gynecology

## 2024-01-09 ENCOUNTER — Ambulatory Visit (INDEPENDENT_AMBULATORY_CARE_PROVIDER_SITE_OTHER): Admitting: Obstetrics and Gynecology

## 2024-01-09 VITALS — BP 102/64 | HR 67 | Temp 97.8°F | Ht 65.25 in | Wt 151.0 lb

## 2024-01-09 DIAGNOSIS — Z01419 Encounter for gynecological examination (general) (routine) without abnormal findings: Secondary | ICD-10-CM | POA: Diagnosis not present

## 2024-01-09 DIAGNOSIS — Z1331 Encounter for screening for depression: Secondary | ICD-10-CM | POA: Diagnosis not present

## 2024-01-09 NOTE — Assessment & Plan Note (Signed)
 Cervical cancer screening performed according to ASCCP guidelines. Encouraged annual mammogram screening Colonoscopy UTD DXA , plan for 63yo Labs and immunizations with her primary Encouraged safe sexual practices as indicated Encouraged healthy lifestyle practices with diet and exercise For patients under 50-70yo, I recommend 1200mg  calcium daily and 600IU of vitamin D daily.

## 2024-01-09 NOTE — Patient Instructions (Signed)

## 2024-01-20 ENCOUNTER — Other Ambulatory Visit (HOSPITAL_COMMUNITY): Payer: Self-pay

## 2024-01-21 ENCOUNTER — Other Ambulatory Visit (HOSPITAL_COMMUNITY): Payer: Self-pay

## 2024-01-31 ENCOUNTER — Ambulatory Visit (HOSPITAL_COMMUNITY)
Admission: RE | Admit: 2024-01-31 | Discharge: 2024-01-31 | Disposition: A | Discharge status: Home / Self Care | Payer: Self-pay | Source: Ambulatory Visit | Attending: Internal Medicine | Admitting: Internal Medicine

## 2024-01-31 DIAGNOSIS — Z136 Encounter for screening for cardiovascular disorders: Secondary | ICD-10-CM | POA: Insufficient documentation

## 2024-02-10 ENCOUNTER — Ambulatory Visit: Admitting: Obstetrics and Gynecology

## 2024-02-13 ENCOUNTER — Ambulatory Visit: Admitting: Obstetrics and Gynecology

## 2024-02-18 ENCOUNTER — Other Ambulatory Visit (HOSPITAL_COMMUNITY): Payer: Self-pay

## 2024-03-09 ENCOUNTER — Other Ambulatory Visit (HOSPITAL_COMMUNITY): Payer: Self-pay

## 2024-04-06 DIAGNOSIS — D2262 Melanocytic nevi of left upper limb, including shoulder: Secondary | ICD-10-CM | POA: Diagnosis not present

## 2024-04-06 DIAGNOSIS — D485 Neoplasm of uncertain behavior of skin: Secondary | ICD-10-CM | POA: Diagnosis not present

## 2024-04-06 DIAGNOSIS — L718 Other rosacea: Secondary | ICD-10-CM | POA: Diagnosis not present

## 2024-04-06 DIAGNOSIS — D2371 Other benign neoplasm of skin of right lower limb, including hip: Secondary | ICD-10-CM | POA: Diagnosis not present

## 2024-04-06 DIAGNOSIS — L738 Other specified follicular disorders: Secondary | ICD-10-CM | POA: Diagnosis not present

## 2024-04-06 DIAGNOSIS — L819 Disorder of pigmentation, unspecified: Secondary | ICD-10-CM | POA: Diagnosis not present

## 2024-04-06 DIAGNOSIS — L821 Other seborrheic keratosis: Secondary | ICD-10-CM | POA: Diagnosis not present

## 2024-04-06 DIAGNOSIS — D2372 Other benign neoplasm of skin of left lower limb, including hip: Secondary | ICD-10-CM | POA: Diagnosis not present

## 2024-04-06 DIAGNOSIS — L814 Other melanin hyperpigmentation: Secondary | ICD-10-CM | POA: Diagnosis not present

## 2024-04-06 DIAGNOSIS — D225 Melanocytic nevi of trunk: Secondary | ICD-10-CM | POA: Diagnosis not present

## 2024-04-16 ENCOUNTER — Other Ambulatory Visit (HOSPITAL_COMMUNITY): Payer: Self-pay

## 2024-04-27 DIAGNOSIS — D485 Neoplasm of uncertain behavior of skin: Secondary | ICD-10-CM | POA: Diagnosis not present

## 2024-04-27 DIAGNOSIS — L988 Other specified disorders of the skin and subcutaneous tissue: Secondary | ICD-10-CM | POA: Diagnosis not present

## 2024-04-30 DIAGNOSIS — Z860100 Personal history of colon polyps, unspecified: Secondary | ICD-10-CM | POA: Diagnosis not present

## 2024-04-30 DIAGNOSIS — Z1211 Encounter for screening for malignant neoplasm of colon: Secondary | ICD-10-CM | POA: Diagnosis not present

## 2024-05-11 ENCOUNTER — Other Ambulatory Visit (HOSPITAL_COMMUNITY): Payer: Self-pay

## 2024-05-11 MED ORDER — GOLYTELY 236 G PO SOLR
ORAL | 0 refills | Status: AC
  Filled 2024-05-11: qty 4000, 2d supply, fill #0

## 2024-05-18 ENCOUNTER — Encounter: Payer: Self-pay | Admitting: Radiology

## 2024-05-25 DIAGNOSIS — D122 Benign neoplasm of ascending colon: Secondary | ICD-10-CM | POA: Diagnosis not present

## 2024-05-25 DIAGNOSIS — K635 Polyp of colon: Secondary | ICD-10-CM | POA: Diagnosis not present

## 2024-05-25 DIAGNOSIS — K644 Residual hemorrhoidal skin tags: Secondary | ICD-10-CM | POA: Diagnosis not present

## 2024-05-25 DIAGNOSIS — Z1211 Encounter for screening for malignant neoplasm of colon: Secondary | ICD-10-CM | POA: Diagnosis not present

## 2024-05-25 DIAGNOSIS — Z09 Encounter for follow-up examination after completed treatment for conditions other than malignant neoplasm: Secondary | ICD-10-CM | POA: Diagnosis not present

## 2024-05-25 DIAGNOSIS — Z860100 Personal history of colon polyps, unspecified: Secondary | ICD-10-CM | POA: Diagnosis not present

## 2024-05-25 LAB — HM COLONOSCOPY

## 2024-05-29 DIAGNOSIS — H524 Presbyopia: Secondary | ICD-10-CM | POA: Diagnosis not present

## 2024-05-29 DIAGNOSIS — H5213 Myopia, bilateral: Secondary | ICD-10-CM | POA: Diagnosis not present

## 2024-05-29 DIAGNOSIS — H52203 Unspecified astigmatism, bilateral: Secondary | ICD-10-CM | POA: Diagnosis not present

## 2024-07-14 ENCOUNTER — Other Ambulatory Visit (HOSPITAL_COMMUNITY): Payer: Self-pay
